# Patient Record
Sex: Female | Born: 1990 | Hispanic: No | Marital: Married | Smoking: Never smoker
Health system: Southern US, Community
[De-identification: ages and names within clinical notes are randomized; demographics above are authoritative.]

## PROBLEM LIST (undated history)

## (undated) DIAGNOSIS — M797 Fibromyalgia: Secondary | ICD-10-CM

## (undated) DIAGNOSIS — Z30432 Encounter for removal of intrauterine contraceptive device: Principal | ICD-10-CM

## (undated) DIAGNOSIS — I7300 Raynaud's syndrome without gangrene: Secondary | ICD-10-CM

## (undated) DIAGNOSIS — D649 Anemia, unspecified: Secondary | ICD-10-CM

## (undated) DIAGNOSIS — E66811 Obesity, class 1: Secondary | ICD-10-CM

## (undated) DIAGNOSIS — N921 Excessive and frequent menstruation with irregular cycle: Principal | ICD-10-CM

## (undated) DIAGNOSIS — F32A Depression, unspecified: Secondary | ICD-10-CM

## (undated) DIAGNOSIS — F329 Major depressive disorder, single episode, unspecified: Secondary | ICD-10-CM

## (undated) DIAGNOSIS — E669 Obesity, unspecified: Secondary | ICD-10-CM

## (undated) DIAGNOSIS — F419 Anxiety disorder, unspecified: Secondary | ICD-10-CM

## (undated) DIAGNOSIS — E010 Iodine-deficiency related diffuse (endemic) goiter: Secondary | ICD-10-CM

## (undated) DIAGNOSIS — K8010 Calculus of gallbladder with chronic cholecystitis without obstruction: Secondary | ICD-10-CM

## (undated) DIAGNOSIS — G43109 Migraine with aura, not intractable, without status migrainosus: Secondary | ICD-10-CM

## (undated) DIAGNOSIS — Z309 Encounter for contraceptive management, unspecified: Secondary | ICD-10-CM

## (undated) HISTORY — DX: Excessive and frequent menstruation with irregular cycle: N92.1

## (undated) HISTORY — DX: Depression, unspecified: F32.A

## (undated) HISTORY — DX: Obesity, class 1: E66.811

## (undated) HISTORY — DX: Encounter for contraceptive management, unspecified: Z30.9

## (undated) HISTORY — DX: Encounter for removal of intrauterine contraceptive device: Z30.432

## (undated) HISTORY — DX: Fibromyalgia: M79.7

## (undated) HISTORY — DX: Migraine with aura, not intractable, without status migrainosus: G43.109

## (undated) HISTORY — DX: Anxiety disorder, unspecified: F41.9

## (undated) HISTORY — PX: FOOT SURGERY: SHX648

## (undated) HISTORY — DX: Major depressive disorder, single episode, unspecified: F32.9

## (undated) HISTORY — DX: Calculus of gallbladder with chronic cholecystitis without obstruction: K80.10

## (undated) HISTORY — DX: Obesity, unspecified: E66.9

## (undated) HISTORY — DX: Raynaud's syndrome without gangrene: I73.00

## (undated) HISTORY — DX: Iodine-deficiency related diffuse (endemic) goiter: E01.0

## (undated) HISTORY — DX: Anemia, unspecified: D64.9

---
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
## 2008-02-02 ENCOUNTER — Other Ambulatory Visit: Admission: RE | Admit: 2008-02-02 | Discharge: 2008-02-02 | Payer: Self-pay | Admitting: Obstetrics and Gynecology

## 2009-03-15 ENCOUNTER — Other Ambulatory Visit: Admission: RE | Admit: 2009-03-15 | Discharge: 2009-03-15 | Payer: Self-pay | Admitting: Obstetrics and Gynecology

## 2010-11-16 ENCOUNTER — Other Ambulatory Visit (HOSPITAL_COMMUNITY)
Admission: RE | Admit: 2010-11-16 | Discharge: 2010-11-16 | Disposition: A | Discharge status: Home / Self Care | Payer: BC Managed Care – PPO | Source: Ambulatory Visit | Attending: Obstetrics and Gynecology | Admitting: Obstetrics and Gynecology

## 2010-11-16 DIAGNOSIS — Z01419 Encounter for gynecological examination (general) (routine) without abnormal findings: Secondary | ICD-10-CM | POA: Insufficient documentation

## 2010-11-16 DIAGNOSIS — Z113 Encounter for screening for infections with a predominantly sexual mode of transmission: Secondary | ICD-10-CM | POA: Insufficient documentation

## 2011-03-27 NOTE — L&D Delivery Note (Signed)
Delivery Note At 6:05 PM a viable female was delivered via Vaginal, Spontaneous Delivery (Presentation: ;  ).  APGAR: 9, 9; weight 9 lb 9.3 oz (4346 g).   Placenta status: Intact, Spontaneous.  Cord: 3 vessels with the following complications: None.    Anesthesia: Epidural  Episiotomy: None Lacerations: 2nd degree Suture Repair: 2.0 3.0 vicryl Est. Blood Loss (mL): 250cc  Mom to postpartum.  Baby to nursery-stable.  Stellar Gensel 06/10/2011, 9:24 PM

## 2011-05-25 LAB — STREP B DNA PROBE: GBS: NEGATIVE

## 2011-05-28 LAB — ABO/RH: RH Type: POSITIVE

## 2011-06-09 ENCOUNTER — Inpatient Hospital Stay (HOSPITAL_COMMUNITY)
Admission: AD | Admit: 2011-06-09 | Discharge: 2011-06-09 | Disposition: A | Discharge status: Home / Self Care | Payer: Managed Care, Other (non HMO) | Source: Ambulatory Visit | Attending: Family Medicine | Admitting: Family Medicine

## 2011-06-09 ENCOUNTER — Encounter (HOSPITAL_COMMUNITY): Payer: Self-pay | Admitting: *Deleted

## 2011-06-09 DIAGNOSIS — O479 False labor, unspecified: Secondary | ICD-10-CM

## 2011-06-09 NOTE — MAU Provider Note (Signed)
Chart reviewed and agree with management and plan.  

## 2011-06-09 NOTE — MAU Note (Signed)
Pt reports having ctxs on and off all night. Reports 4-5 min apart now has some brownish discharge a good fetal movement.

## 2011-06-09 NOTE — Discharge Instructions (Signed)

## 2011-06-09 NOTE — MAU Provider Note (Signed)
  History     CSN: [id number]  Arrival date and time: 06/09/11 1020   First Provider Initiated Contact with Patient 06/09/11 1059      Chief Complaint  Patient presents with  . Labor Eval   HPI Patient presents with contractions.  She is feeling them every 5 minutes.  They began 24 hours ago and have gotten closer together.  No vaginal bleeding, leakage of fluid.  Good fetal movement. Her pregnancy has been uneventful, she is only taking a prenatal vitamin. She was at [facility] yesterday and her cervix was 2 cm dilated.   OB History    Grav Para Term Preterm Abortions TAB SAB Ect Mult Living   1               Past Medical History  Diagnosis Date  . No pertinent past medical history     Past Surgical History  Procedure Date  . Foot surgery     Family History  Problem Relation Age of Onset  . Fibromyalgia Mother   . Fibromyalgia Sister     History  Substance Use Topics  . Smoking status: Never Smoker   . Smokeless tobacco: Not on file  . Alcohol Use: No    Allergies: No Known Allergies  Prescriptions prior to admission  Medication Sig Dispense Refill  . Prenatal Vit-Fe Fumarate-FA (PRENATAL MULTIVITAMIN) TABS Take 1 tablet by mouth daily.        ROS Physical Exam   Blood pressure 118/60, pulse 94, temperature 98.8 F (37.1 C), temperature source Oral, resp. rate 18.  Physical Exam  Constitutional: She is oriented to person, place, and time. She appears well-developed and well-nourished. No distress.  HENT:  Head: Normocephalic and atraumatic.  Mouth/Throat: Oropharynx is clear and moist.  Eyes: Pupils are equal, round, and reactive to light.  Neck: Normal range of motion. No JVD present.  Cardiovascular: Normal rate, regular rhythm and normal heart sounds.   Respiratory: Effort normal and breath sounds normal.  GI:       Gravid, non-tender.   Musculoskeletal: She exhibits no edema.  Neurological: She is alert and oriented to person, place, and time.    Fetal monitor: baseline 140's + accels, no decels.  Toco: No contractions SVE: 2/50%/-2  SVE recheck after 1 hour of ambulation: 2/50%/-2  MAU Course  Procedures  MDM Patient walked x 1 hour, with intermittent monitoring, no contractions seen, no cervical change.   Assessment and Plan  21 year old G1 @ [redacted]w[redacted]d who presents complaining of contractions, no cervical change:  -not in active labor, may be early labor.  -advised patient of this, advised to return when contraction intensity increases.   Vernis Cabacungan 06/09/2011, 12:14 PM

## 2011-06-10 ENCOUNTER — Encounter (HOSPITAL_COMMUNITY): Payer: Self-pay | Admitting: Anesthesiology

## 2011-06-10 ENCOUNTER — Encounter (HOSPITAL_COMMUNITY): Payer: Self-pay | Admitting: Obstetrics and Gynecology

## 2011-06-10 ENCOUNTER — Inpatient Hospital Stay (HOSPITAL_COMMUNITY)
Admission: AD | Admit: 2011-06-10 | Discharge: 2011-06-12 | DRG: 775 | Disposition: A | Discharge status: Home / Self Care | Payer: Managed Care, Other (non HMO) | Source: Ambulatory Visit | Attending: Family Medicine | Admitting: Family Medicine

## 2011-06-10 ENCOUNTER — Encounter (HOSPITAL_COMMUNITY): Payer: Self-pay | Admitting: *Deleted

## 2011-06-10 ENCOUNTER — Inpatient Hospital Stay (HOSPITAL_COMMUNITY): Payer: Managed Care, Other (non HMO) | Admitting: Anesthesiology

## 2011-06-10 LAB — CBC
HCT: 41 % (ref 36.0–46.0)
MCHC: 33.4 g/dL (ref 30.0–36.0)
RDW: 14.7 % (ref 11.5–15.5)
WBC: 14.4 10*3/uL — ABNORMAL HIGH (ref 4.0–10.5)

## 2011-06-10 LAB — RPR: RPR Ser Ql: NONREACTIVE

## 2011-06-10 MED ORDER — LACTATED RINGERS IV SOLN: 500.0000 mL | INTRAVENOUS | Status: DC | PRN

## 2011-06-10 MED ORDER — TETANUS-DIPHTH-ACELL PERTUSSIS 5-2.5-18.5 LF-MCG/0.5 IM SUSP
0.5000 mL | Freq: Once | INTRAMUSCULAR | Status: AC
  Administered 2011-06-11: 0.5 mL via INTRAMUSCULAR
  Filled 2011-06-10: qty 0.5

## 2011-06-10 MED ORDER — TERBUTALINE SULFATE 1 MG/ML IJ SOLN: 0.2500 mg | Freq: Once | INTRAMUSCULAR | Status: DC | PRN

## 2011-06-10 MED ORDER — MISOPROSTOL 200 MCG PO TABS
ORAL_TABLET | ORAL | Status: AC
  Administered 2011-06-10: 800 ug
  Filled 2011-06-10: qty 4

## 2011-06-10 MED ORDER — LACTATED RINGERS IV SOLN
INTRAVENOUS | Status: DC
  Administered 2011-06-10 (×2): via INTRAVENOUS

## 2011-06-10 MED ORDER — LIDOCAINE HCL (PF) 1 % IJ SOLN
INTRAMUSCULAR | Status: DC | PRN
  Administered 2011-06-10: 4 mL
  Administered 2011-06-10: 5 mL

## 2011-06-10 MED ORDER — ONDANSETRON HCL 4 MG/2ML IJ SOLN
4.0000 mg | Freq: Four times a day (QID) | INTRAMUSCULAR | Status: DC | PRN
  Administered 2011-06-10: 4 mg via INTRAVENOUS
  Filled 2011-06-10: qty 2

## 2011-06-10 MED ORDER — OXYTOCIN 20 UNITS IN LACTATED RINGERS INFUSION - SIMPLE: 125.0000 mL/h | Freq: Once | INTRAVENOUS | Status: DC

## 2011-06-10 MED ORDER — LIDOCAINE HCL (PF) 1 % IJ SOLN
30.0000 mL | INTRAMUSCULAR | Status: DC | PRN
  Administered 2011-06-10: 30 mL via SUBCUTANEOUS
  Filled 2011-06-10: qty 30

## 2011-06-10 MED ORDER — EPHEDRINE 5 MG/ML INJ
INTRAVENOUS | Status: AC
  Filled 2011-06-10: qty 4

## 2011-06-10 MED ORDER — SENNOSIDES-DOCUSATE SODIUM 8.6-50 MG PO TABS
2.0000 | ORAL_TABLET | Freq: Every day | ORAL | Status: DC
  Administered 2011-06-10 – 2011-06-11 (×2): 2 via ORAL

## 2011-06-10 MED ORDER — BENZOCAINE-MENTHOL 20-0.5 % EX AERO
INHALATION_SPRAY | CUTANEOUS | Status: AC
  Filled 2011-06-10: qty 56

## 2011-06-10 MED ORDER — PHENYLEPHRINE 40 MCG/ML (10ML) SYRINGE FOR IV PUSH (FOR BLOOD PRESSURE SUPPORT): 80.0000 ug | PREFILLED_SYRINGE | INTRAVENOUS | Status: DC | PRN

## 2011-06-10 MED ORDER — FENTANYL 2.5 MCG/ML BUPIVACAINE 1/10 % EPIDURAL INFUSION (WH - ANES)
14.0000 mL/h | INTRAMUSCULAR | Status: DC
  Administered 2011-06-10: 15 mL/h via EPIDURAL
  Filled 2011-06-10: qty 60

## 2011-06-10 MED ORDER — OXYCODONE-ACETAMINOPHEN 5-325 MG PO TABS: 1.0000 | ORAL_TABLET | ORAL | Status: DC | PRN

## 2011-06-10 MED ORDER — EPHEDRINE 5 MG/ML INJ: 10.0000 mg | INTRAVENOUS | Status: DC | PRN

## 2011-06-10 MED ORDER — FENTANYL 2.5 MCG/ML BUPIVACAINE 1/10 % EPIDURAL INFUSION (WH - ANES)
INTRAMUSCULAR | Status: AC
  Administered 2011-06-10: 15 mL/h via EPIDURAL
  Filled 2011-06-10: qty 60

## 2011-06-10 MED ORDER — ACETAMINOPHEN 325 MG PO TABS: 650.0000 mg | ORAL_TABLET | ORAL | Status: DC | PRN

## 2011-06-10 MED ORDER — PHENYLEPHRINE 40 MCG/ML (10ML) SYRINGE FOR IV PUSH (FOR BLOOD PRESSURE SUPPORT)
PREFILLED_SYRINGE | INTRAVENOUS | Status: AC
  Filled 2011-06-10: qty 5

## 2011-06-10 MED ORDER — ONDANSETRON HCL 4 MG/2ML IJ SOLN: 4.0000 mg | INTRAMUSCULAR | Status: DC | PRN

## 2011-06-10 MED ORDER — ONDANSETRON HCL 4 MG PO TABS: 4.0000 mg | ORAL_TABLET | ORAL | Status: DC | PRN

## 2011-06-10 MED ORDER — DIBUCAINE 1 % RE OINT: 1.0000 "application " | TOPICAL_OINTMENT | RECTAL | Status: DC | PRN

## 2011-06-10 MED ORDER — LACTATED RINGERS IV SOLN
500.0000 mL | Freq: Once | INTRAVENOUS | Status: AC
  Administered 2011-06-10: 500 mL via INTRAVENOUS

## 2011-06-10 MED ORDER — DIPHENHYDRAMINE HCL 25 MG PO CAPS: 25.0000 mg | ORAL_CAPSULE | Freq: Four times a day (QID) | ORAL | Status: DC | PRN

## 2011-06-10 MED ORDER — DIPHENHYDRAMINE HCL 50 MG/ML IJ SOLN: 12.5000 mg | INTRAMUSCULAR | Status: DC | PRN

## 2011-06-10 MED ORDER — SIMETHICONE 80 MG PO CHEW: 80.0000 mg | CHEWABLE_TABLET | ORAL | Status: DC | PRN

## 2011-06-10 MED ORDER — FENTANYL 2.5 MCG/ML BUPIVACAINE 1/10 % EPIDURAL INFUSION (WH - ANES)
INTRAMUSCULAR | Status: DC | PRN
  Administered 2011-06-10: 15 mL/h via EPIDURAL

## 2011-06-10 MED ORDER — IBUPROFEN 600 MG PO TABS
600.0000 mg | ORAL_TABLET | Freq: Four times a day (QID) | ORAL | Status: DC
  Administered 2011-06-10 – 2011-06-12 (×6): 600 mg via ORAL
  Filled 2011-06-10 (×6): qty 1

## 2011-06-10 MED ORDER — BENZOCAINE-MENTHOL 20-0.5 % EX AERO
1.0000 "application " | INHALATION_SPRAY | CUTANEOUS | Status: DC | PRN
  Administered 2011-06-10: 21:00:00 via TOPICAL

## 2011-06-10 MED ORDER — FLEET ENEMA 7-19 GM/118ML RE ENEM: 1.0000 | ENEMA | RECTAL | Status: DC | PRN

## 2011-06-10 MED ORDER — OXYTOCIN 20 UNITS IN LACTATED RINGERS INFUSION - SIMPLE
1.0000 m[IU]/min | INTRAVENOUS | Status: DC
  Administered 2011-06-10: 2 m[IU]/min via INTRAVENOUS

## 2011-06-10 MED ORDER — CITRIC ACID-SODIUM CITRATE 334-500 MG/5ML PO SOLN: 30.0000 mL | ORAL | Status: DC | PRN

## 2011-06-10 MED ORDER — ZOLPIDEM TARTRATE 5 MG PO TABS: 5.0000 mg | ORAL_TABLET | Freq: Every evening | ORAL | Status: DC | PRN

## 2011-06-10 MED ORDER — OXYTOCIN BOLUS FROM INFUSION
500.0000 mL | Freq: Once | INTRAVENOUS | Status: AC
  Administered 2011-06-10: 500 mL via INTRAVENOUS
  Filled 2011-06-10: qty 1000
  Filled 2011-06-10: qty 500

## 2011-06-10 MED ORDER — NALBUPHINE SYRINGE 5 MG/0.5 ML
5.0000 mg | INJECTION | INTRAMUSCULAR | Status: DC | PRN
  Administered 2011-06-10: 5 mg via INTRAVENOUS
  Filled 2011-06-10: qty 0.5

## 2011-06-10 MED ORDER — LANOLIN HYDROUS EX OINT: TOPICAL_OINTMENT | CUTANEOUS | Status: DC | PRN

## 2011-06-10 MED ORDER — PRENATAL MULTIVITAMIN CH
1.0000 | ORAL_TABLET | Freq: Every day | ORAL | Status: DC
  Administered 2011-06-11 – 2011-06-12 (×2): 1 via ORAL
  Filled 2011-06-10 (×2): qty 1

## 2011-06-10 MED ORDER — IBUPROFEN 600 MG PO TABS: 600.0000 mg | ORAL_TABLET | Freq: Four times a day (QID) | ORAL | Status: DC | PRN

## 2011-06-10 MED ORDER — WITCH HAZEL-GLYCERIN EX PADS: 1.0000 "application " | MEDICATED_PAD | CUTANEOUS | Status: DC | PRN

## 2011-06-10 NOTE — H&P (Signed)
Chief Complaint:  Labor Eval   HPI:   Michelle Knapp is a 21 y.o. G1P0000  with Estimated Date of Delivery: 06/15/11 by LMP , now at  [redacted]w[redacted]d weeks gestation who presents with complaint of  contractions every 4 minutes lasting 30-60 seconds. Patient reports the fetal movement as active,  vaginal bleeding as less flow than a normal period and  she describes fluid per vagina as None.  Other associated symptoms include none.    She receives her prenatal care at  [facility] and her prenatal course has been complicated by nothing.   Past Obstetrical History: OB History    Grav Para Term Preterm Abortions TAB SAB Ect Mult Living   1 0 0 0 0 0 0 0 0 0       Past Medical History: Past Medical History  Diagnosis Date  . No pertinent past medical history     Past Surgical History: Past Surgical History  Procedure Date  . Foot surgery     Family History: Family History  Problem Relation Age of Onset  . Fibromyalgia Mother   . Fibromyalgia Sister     Social History: History  Substance Use Topics  . Smoking status: Never Smoker   . Smokeless tobacco: Not on file  . Alcohol Use: No    Allergies: No Known Allergies  Home Medications: Prescriptions prior to admission  Medication Sig Dispense Refill  . Prenatal Vit-Fe Fumarate-FA (PRENATAL MULTIVITAMIN) TABS Take 1 tablet by mouth daily.        General ROS:  negative Focused OB Physical: Cervical Exam: Dilation: 3.5 Effacement (%): 100 Cervical Position: Middle Station: -2 Presentation: Vertex Exam by:: J. Rasch RN  Fetal presentation: cephalic Membranes:intact  External Fetal Monitoring:  Baseline: 140 bpm, Variability: Good {> 6 bpm) and Accelerations: Reactive and contractions are regular, every 4 minutes with moderate intensity.  Labs: No results found for this or any previous visit (from the past 24 hour(s)). GBS negative  ASSESSMENT: Michelle Knapp G1P0000 [redacted]w[redacted]d at weeks gestation admitted for spontaneous onset of  labor  PLAN: Admit to L&D for labor. Patient would like epidural eventually.  [name] 06/10/2011,7:49 AM

## 2011-06-10 NOTE — Anesthesia Preprocedure Evaluation (Signed)
Anesthesia Evaluation  Patient identified by MRN, date of birth, ID band Patient awake    Reviewed: Allergy & Precautions, H&P , Patient's Chart, lab work & pertinent test results  Airway Mallampati: II TM Distance: >3 FB Neck ROM: full    Dental No notable dental hx. (+) Teeth Intact   Pulmonary neg pulmonary ROS,  breath sounds clear to auscultation  Pulmonary exam normal       Cardiovascular negative cardio ROS  Rhythm:regular Rate:Normal     Neuro/Psych negative neurological ROS  negative psych ROS   GI/Hepatic negative GI ROS, Neg liver ROS,   Endo/Other  Morbid obesity  Renal/GU negative Renal ROS  negative genitourinary   Musculoskeletal   Abdominal Normal abdominal exam  (+)   Peds  Hematology negative hematology ROS (+)   Anesthesia Other Findings   Reproductive/Obstetrics (+) Pregnancy                           Anesthesia Physical Anesthesia Plan  ASA: III  Anesthesia Plan: Epidural   Post-op Pain Management:    Induction:   Airway Management Planned:   Additional Equipment:   Intra-op Plan:   Post-operative Plan:   Informed Consent: I have reviewed the patients History and Physical, chart, labs and discussed the procedure including the risks, benefits and alternatives for the proposed anesthesia with the patient or authorized representative who has indicated his/her understanding and acceptance.     Plan Discussed with: Anesthesiologist and Surgeon  Anesthesia Plan Comments:         Anesthesia Quick Evaluation

## 2011-06-10 NOTE — Progress Notes (Signed)
Patient ID: Michelle Knapp, female   DOB: 1990/06/06, 21 y.o.   MRN: [id number] Michelle Knapp is a 21 y.o. G1P0000 at [redacted]w[redacted]d admitted for active labor  Subjective: Doing well, feeling some pressure in her back/butt and some tightening with her contractions but overall very comfortable.   Objective: BP 95/71  Pulse 134  Temp(Src) 98.6 F (37 C) (Oral)  Resp 20  Ht 5\' 9"  (1.753 m)  Wt 247 lb (112.038 kg)  BMI 36.48 kg/m2  SpO2 100%      FHT:  FHR: 130 bpm, variability: minimal ,  accelerations:  Present,  decelerations:  Absent UC:   regular, every 1-2 minutes; MVUs <200 SVE:   Dilation: 8 Effacement (%): 100 Station: 0 Exam by:: DHart Rochester  Labs: Lab Results  Component Value Date   WBC 14.4* 06/10/2011   HGB 13.7 06/10/2011   HCT 41.0 06/10/2011   MCV 87.8 06/10/2011   PLT 188 06/10/2011    Assessment / Plan: Spontaneous labor, progressing normally  Labor: Ctx inadequate, but patient making cervical change so will hold on pitocin at this time Fetal Wellbeing:  Category I Pain Control:  Epidural I/D:  n/a Anticipated MOD:  NSVD  Michelle Knapp 06/10/2011, 3:34 PM

## 2011-06-10 NOTE — Progress Notes (Signed)
Patient ID: Michelle Knapp, female   DOB: [birth year], 21 y.o.   MRN: [id number] Uc's inadequate, will start pitocin to facilitate descent. FHR pattern reassuring with accels noted.

## 2011-06-10 NOTE — MAU Note (Signed)
Pt presents to MAU with chief complaint of labor/contractions. Pt was here yesterday and sent home with labor precautions; cervix yesterday was 2cm and 50%. Pt says she was up all night contracting and feels the contractions have gotten stronger.

## 2011-06-10 NOTE — Anesthesia Procedure Notes (Signed)
Epidural Patient location during procedure: OB Start time: 06/10/2011 1:06 PM  Staffing Anesthesiologist: Allure Greaser A. Performed by: anesthesiologist   Preanesthetic Checklist Completed: patient identified, site marked, surgical consent, pre-op evaluation, timeout performed, IV checked, risks and benefits discussed and monitors and equipment checked  Epidural Patient position: sitting Prep: site prepped and draped and DuraPrep Patient monitoring: continuous pulse ox and blood pressure Approach: midline Injection technique: LOR air  Needle:  Needle type: Tuohy  Needle gauge: 17 G Needle length: 9 cm Needle insertion depth: 6 cm Catheter type: closed end flexible Catheter size: 19 Gauge Catheter at skin depth: 11 cm Test dose: negative and Other  Assessment Events: blood not aspirated, injection not painful, no injection resistance, negative IV test and no paresthesia  Additional Notes Patient identified. Risks and benefits discussed including failed block, incomplete  Pain control, post dural puncture headache, nerve damage, paralysis, blood pressure Changes, nausea, vomiting, reactions to medications-both toxic and allergic and post Partum back pain. All questions were answered. Patient expressed understanding and wished to proceed. Sterile technique was used throughout procedure. Epidural site was Dressed with sterile barrier dressing. No paresthesias, signs of intravascular injection Or signs of intrathecal spread were encountered.  Patient was more comfortable after the epidural was dosed. Please see RN's note for documentation of vital signs and FHR which are stable.

## 2011-06-10 NOTE — Progress Notes (Signed)
Patient ID: Michelle Knapp, female   DOB: 10-07-1990, 21 y.o.   MRN: [id number] SVE 4/100/-1 AROM light mec noted. Fhr pattern reassuring.

## 2011-06-11 NOTE — Progress Notes (Signed)
Post Partum Day 1 Subjective: no complaints, up ad lib, voiding, tolerating PO and breastfeeding, hospital circumcision. She states that she sees [facility] and is going to use contraception most likely OCP.   Objective: Blood pressure 118/71, pulse 105, temperature 99.5 F (37.5 C), temperature source Oral, resp. rate 18, height 5\' 9"  (1.753 m), weight 112.038 kg (247 lb), SpO2 66.00%, unknown if currently breastfeeding.  Physical Exam:  General: alert, cooperative and appears stated age Lochia: appropriate Uterine Fundus: soft DVT Evaluation: No evidence of DVT seen on physical exam. Negative Homan's sign. No cords or calf tenderness. No significant calf/ankle edema.   Basename 06/10/11 0845  HGB 13.7  HCT 41.0    Assessment/Plan: Plan for discharge tomorrow, Breastfeeding, Lactation consult, Circumcision prior to discharge and Contraception OCP   LOS: 1 day   Iverson Alamin 06/11/2011, 7:51 AM

## 2011-06-11 NOTE — Anesthesia Postprocedure Evaluation (Signed)
  Anesthesia Post-op Note  Patient: Michelle Knapp  Procedure(s) Performed: * No procedures listed *  Patient Location: PACU and Mother/Baby  Anesthesia Type: Epidural  Level of Consciousness: awake  Airway and Oxygen Therapy: Patient Spontanous Breathing  Post-op Pain: none  Post-op Assessment: Patient's Cardiovascular Status Stable, Respiratory Function Stable, Patent Airway, No signs of Nausea or vomiting, Adequate PO intake, Pain level controlled, No headache, No backache, No residual numbness and No residual motor weakness  Post-op Vital Signs: Reviewed and stable  Complications: No apparent anesthesia complications

## 2011-06-11 NOTE — Progress Notes (Signed)
PGY-1 Addendum  Patient seen and examined by me. I agree with the note above by PA student Dunn. Patient doing well. Plan for discharge tomorrow. All questions and concerns addressed.  Pranathi Winfree M. Demi Trieu, M.D. 06/11/2011 8:00 AM

## 2011-06-12 MED ORDER — IBUPROFEN 600 MG PO TABS: 600.0000 mg | ORAL_TABLET | Freq: Four times a day (QID) | ORAL | Status: AC

## 2011-06-12 MED ORDER — OXYCODONE-ACETAMINOPHEN 5-325 MG PO TABS: 1.0000 | ORAL_TABLET | ORAL | Status: AC | PRN

## 2011-06-12 NOTE — Progress Notes (Signed)
Post Partum Day #2 Subjective: no complaints, up ad lib, voiding, tolerating PO and + flatus Breast feeding with no difficulty. Patient has no questions. Plans on outpatient circ.  Objective: Blood pressure 97/63, pulse 68, temperature 97.7 F (36.5 C), temperature source Oral, resp. rate 18, height 5\' 9"  (1.753 m), weight 112.038 kg (247 lb), SpO2 66.00%, unknown if currently breastfeeding.  Physical Exam:  General: alert, cooperative and no distress Lochia: appropriate Uterine Fundus: firm Incision: N/A DVT Evaluation: No evidence of DVT seen on physical exam.   Basename 06/10/11 0845  HGB 13.7  HCT 41.0    Assessment/Plan: Discharge home, Breastfeeding and Contraception undecided Will follow up at [facility]. All questions addressed.   LOS: 2 days   Michelle Knapp 06/12/2011, 8:01 AM

## 2011-06-12 NOTE — Discharge Summary (Signed)
Agree with above note.  [name] 06/12/2011 10:17 AM

## 2011-06-12 NOTE — Discharge Instructions (Signed)
Vaginal Delivery Care After  Change your pad on each trip to the bathroom.   Wipe gently with toilet paper during your hospital stay. Always wipe from front to back. A spray bottle with warm tap water could also be used or a towelette if available.   Place your soiled pad and toilet paper in a bathroom wastebasket with a plastic bag liner.   During your hospital stay, save any clots. If you pass a clot while on the toilet, do not flush it. Also, if your vaginal flow seems excessive to you, notify nursing personnel.   The first time you get out of bed after delivery, wait for assistance from a nurse. Do not get up alone at any time if you feel weak or dizzy.   Bend and extend your ankles forcefully so that you feel the calves of your legs get hard. Do this 6 times every hour when you are in bed and awake.   Do not sit with one foot under you, dangle your legs over the edge of the bed, or maintain a position that hinders the circulation in your legs.   Many women experience after pains for 2 to 3 days after delivery. These after pains are mild uterine contractions. Ask the nurse for a pain medication if you need something for this. Sometimes breastfeeding stimulates after pains; if you find this to be true, ask for the medication  -  hour before the next feeding.   For you and your infant's protection, do not go beyond the door(s) of the obstetric unit. Do not carry your baby in your arms in the hallway. When taking your baby to and from your room, put your baby in the bassinet and push the bassinet.   Mothers may have their babies in their room as much as they desire.  Document Released: 03/09/2000 Document Revised: 03/01/2011 Document Reviewed: 02/07/2007 ExitCare Patient Information 2012 [employer]. 

## 2011-06-12 NOTE — Discharge Summary (Signed)
Obstetric Discharge Summary Reason for Admission: onset of labor Prenatal Procedures: NST and ultrasound Intrapartum Procedures: spontaneous vaginal delivery Postpartum Procedures: none Complications-Operative and Postpartum: 2nd degree perineal laceration Hemoglobin  Date Value Range Status  06/10/2011 13.7  12.0-15.0 (g/dL) Final     HCT  Date Value Range Status  06/10/2011 41.0  36.0-46.0 (%) Final    Physical Exam:  General: alert, cooperative and no distress Lochia: appropriate Uterine Fundus: firm Incision: n/a DVT Evaluation: No evidence of DVT seen on physical exam.  Discharge Diagnoses: Term Pregnancy-delivered  Discharge Information: Date: 06/12/2011 Activity: pelvic rest Diet: routine Medications: Ibuprofen and Percocet Condition: stable Instructions: refer to practice specific booklet Discharge to: home   Newborn Data: Live born female  Birth Weight: 9 lb 9.3 oz (4346 g) APGAR: 9, 9  Home with mother. Desires outpatient circ. Breast feeding with no difficulty. Will follow up at [facility]. Unsure of contraception, but most likely OCP.  Michelle Knapp 06/12/2011, 8:55 AM

## 2011-06-20 NOTE — Progress Notes (Signed)
UR chart review completed.  

## 2012-08-06 ENCOUNTER — Other Ambulatory Visit: Payer: Self-pay | Admitting: Obstetrics and Gynecology

## 2012-10-20 ENCOUNTER — Ambulatory Visit (INDEPENDENT_AMBULATORY_CARE_PROVIDER_SITE_OTHER): Payer: BC Managed Care – PPO | Admitting: Family Medicine

## 2012-10-20 ENCOUNTER — Encounter: Payer: Self-pay | Admitting: Family Medicine

## 2012-10-20 VITALS — BP 143/89 | HR 84 | Temp 98.3°F | Resp 16 | Ht 69.0 in | Wt 192.0 lb

## 2012-10-20 DIAGNOSIS — R5381 Other malaise: Secondary | ICD-10-CM

## 2012-10-20 DIAGNOSIS — R10816 Epigastric abdominal tenderness: Secondary | ICD-10-CM

## 2012-10-20 DIAGNOSIS — E049 Nontoxic goiter, unspecified: Secondary | ICD-10-CM

## 2012-10-20 DIAGNOSIS — E010 Iodine-deficiency related diffuse (endemic) goiter: Secondary | ICD-10-CM

## 2012-10-20 DIAGNOSIS — M7918 Myalgia, other site: Secondary | ICD-10-CM

## 2012-10-20 DIAGNOSIS — M791 Myalgia, unspecified site: Secondary | ICD-10-CM

## 2012-10-20 DIAGNOSIS — IMO0001 Reserved for inherently not codable concepts without codable children: Secondary | ICD-10-CM

## 2012-10-20 DIAGNOSIS — R5383 Other fatigue: Secondary | ICD-10-CM

## 2012-10-20 LAB — CBC WITH DIFFERENTIAL/PLATELET
Basophils Relative: 0.5 % (ref 0.0–3.0)
Eosinophils Relative: 0.8 % (ref 0.0–5.0)
HCT: 41.5 % (ref 36.0–46.0)
Hemoglobin: 13.8 g/dL (ref 12.0–15.0)
Lymphs Abs: 2.6 10*3/uL (ref 0.7–4.0)
MCV: 88.7 fl (ref 78.0–100.0)
Monocytes Absolute: 0.8 10*3/uL (ref 0.1–1.0)
Monocytes Relative: 8.1 % (ref 3.0–12.0)
RBC: 4.68 Mil/uL (ref 3.87–5.11)
WBC: 9.8 10*3/uL (ref 4.5–10.5)

## 2012-10-20 LAB — COMPREHENSIVE METABOLIC PANEL
CO2: 29 mEq/L (ref 19–32)
Calcium: 9.3 mg/dL (ref 8.4–10.5)
Chloride: 103 mEq/L (ref 96–112)
Creatinine, Ser: 0.7 mg/dL (ref 0.4–1.2)
GFR: 116.62 mL/min (ref >60.00)
Glucose, Bld: 72 mg/dL (ref 70–99)
Total Bilirubin: 0.4 mg/dL (ref 0.3–1.2)
Total Protein: 7.1 g/dL (ref 6.0–8.3)

## 2012-10-20 LAB — SEDIMENTATION RATE: Sed Rate: 5 mm/hr (ref 0–22)

## 2012-10-20 LAB — T4, FREE: Free T4: 0.91 ng/dL (ref 0.60–1.60)

## 2012-10-20 LAB — CK: Total CK: 50 U/L (ref 7–177)

## 2012-10-20 NOTE — Progress Notes (Addendum)
Office Note 10/20/2012  CC:  Chief Complaint  Patient presents with  . Establish Care    HPI:  Michelle Knapp is a 22 y.o. White female who is here to establish care. Patient's most recent primary MD: none Old records were not reviewed prior to or during today's visit.  Approx 1 yr hx of inferior sternum region pain that is worse after mild activity but not consistently and often awakens her from sleep.  Described as severe soreness/pressure/burning, hurts when she presses on the area. No effect from eating.  Mom has shared her flexeril and hydrocodone with her and she has taken it on the most severe occasions.  No OTC meds have been tried for this pain.  Denies GERD.  In addition she has approx 2-3 yr hx of feeling more muscle aches in neck, upper shoulders, sometimes hips, sometimes lower back----after doing normal activity like housework.  Used to be inconsistent but now has been occuring more like every week for the last several months.  She used to walk about 2 mi per day up until about 1 mo ago (due to lack of time, working on house lately, etc) and this caused no problem.   ROS: +chronic fatigue.  +stiffness in LE's mostly after sitting for a long time. no joint swelling, no unexplained rashes.  No unexplained fevers or wt loss. She has hx of loose postprandial BMs esp when nervous.  No blood or mucous in stools. Appetite is good.  No insomnia.  No HAs or vision or hearing changes.  No weakness or paresthesias. +hx of feet turning purplish when she dangles them--resolves with movement.  No hx of hands turning colors or having temp changes.  No cough, no ST, no SOB.   No palpitations.   Bp has run a bit higher ever since having her son 16 mo ago (no HTN during preg). No chronic dep or anxiety.  No urinary sx's. LMP was about 2 wks ago, menses regular, describes heavy bleeding with menses x 4d, 2 days light.  + hx of iron def anemia from menorrhagia.      Past Medical History   Diagnosis Date  . 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 Foot surgery  age [age] yrs    arches lowered on both feet: hx of both feet with chronic pain and episodes of turning blue    Family History  Problem Relation Age of Onset  . Fibromyalgia Mother   . Fibromyalgia Sister   . Diabetes Maternal Grandmother   . Diabetes Paternal Grandmother   . Cancer Paternal Grandfather     lungs    History   Social History  . Marital Status: Married    Spouse Name: N/A    Number of Children: N/A  . Years of Education: N/A   Occupational History  . Not on file.   Social History Main Topics  . Smoking status: Never Smoker   . Smokeless tobacco: Never Used  . Alcohol Use: No  . Drug Use: No  . Sexually Active: Yes    Birth Control/ Protection: None   Other Topics Concern  . Not on file   Social History Narrative   Married and lives in [place] with husband and 19 mo old son.   Homemaker.   HS: [employer].   Beauty school.     No Tob.  Occ alc.  No  drugs.   Exercise: used to walk, o/w none.    Outpatient Encounter Prescriptions as of 10/20/2012  Medication Sig Dispense Refill  . SPRINTEC 28 0.25-35 MG-MCG tablet TAKE ONE TABLET BY MOUTH EVERY DAY  28 tablet  11  . Prenatal Vit-Fe Fumarate-FA (PRENATAL MULTIVITAMIN) TABS Take 1 tablet by mouth daily.       No facility-administered encounter medications on file as of 10/20/2012.    No Known Allergies  ROS See HPI PE; Blood pressure 143/89, pulse 84, temperature 98.3 F (36.8 C), temperature source Temporal, resp. rate 16, height 5\' 9"  (1.753 m), weight 192 lb (87.091 kg), last menstrual period 10/06/2012, SpO2 100.00%, not currently breastfeeding. Gen: Alert, well appearing.  Patient is oriented to person, place, time, and situation. ENT:  Eyes: no injection, icteris, swelling, or exudate.  EOMI, PERRLA.  Fundoscopy: normal retinal  vasculature, normal optic discs. Nose: no drainage or turbinate edema/swelling.  No injection or focal lesion.  Mouth: lips without lesion/swelling.  Oral mucosa pink and moist.  Dentition intact and without obvious caries or gingival swelling.  Oropharynx without erythema, exudate, or swelling.  Neck - No masses, tenderness, or limitation in ROM.  Thyroid gland is enlarged diffusely, without nodularity/induration/discrete mass, or tenderness. CV: RRR, no m/r/g.   LUNGS: CTA bilat, nonlabored resps, good aeration in all lung fields. ABD: soft, nondistended.  She has diffuse TTP in superior-most epigastric abdominal regions diffusely and this extended to include the lower 2 ribs on each side and the lower 1/3 of the sternum centrally.  Areas of most tenderness were lower sternum and xyphoid process. No HSM or mass or bruit.  BS normal. EXT: no clubbing, cyanosis, or edema.  Musculoskeletal: no joint swelling, erythema, warmth, or tenderness.  ROM of all joints intact. Mild symmetrical tenderness to palpation of paraspinous soft tissues in neck, upper back, mid back.  No lower back or glut/sacral tenderness. No tenderness of greater trochanter areas but she has some mild tenderness of the lateral portions of iliac crests. No thigh/hamstring tenderness.  Mild tenderness diffusely in calves.   Skin - no sores or suspicious lesions or rashes or color changes.  Pertinent labs:  None today  ASSESSMENT AND PLAN:   New pt: no old records to obtain.  Myofascial pain Mostly mild, but it is unclear if the more significant sternal/xyphoid pain she has been having is musculoskeletal pain or not--possibly GI (gastritis/esophagitis). I have recommended she start prilosec 20mg  qd and since she had some diffuse upper abd tenderness (as well as her lower rib region/lower sternal region) will do abd u/s. Trial of omeprazole 20mg  qAM was recommended. With her additional c/o generalized fatigue and  musculoskeletal stiffness, will check general lab panel (CBC, CMET, CK total, ESR, Rh factor, and ANA.  Abdominal tenderness, epigastric Abd wall/lower chest wall pain vs gastritis/esophagitis. Omeprazole, labs, and imaging as mentioned above.  Thyromegaly Check TSH, free T4, T3 total and thyroid u/s.   An After Visit Summary was printed and given to the patient.  Spent 50 min with pt today, with >50% of this time spent in counseling and care coordination regarding the above problems.  Return for o/v (30 min) in 10-14d for f/u pains/labs/imaging.

## 2012-10-20 NOTE — Assessment & Plan Note (Signed)
Mostly mild, but it is unclear if the more significant sternal/xyphoid pain she has been having is musculoskeletal pain or not--possibly GI (gastritis/esophagitis). I have recommended she start prilosec 20mg  qd and since she had some diffuse upper abd tenderness (as well as her lower rib region/lower sternal region) will do abd u/s. Trial of omeprazole 20mg  qAM was recommended. With her additional c/o generalized fatigue and musculoskeletal stiffness, will check general lab panel (CBC, CMET, CK total, ESR, Rh factor, and ANA.

## 2012-10-20 NOTE — Assessment & Plan Note (Signed)
Abd wall/lower chest wall pain vs gastritis/esophagitis. Omeprazole, labs, and imaging as mentioned above.

## 2012-10-20 NOTE — Assessment & Plan Note (Signed)
Check TSH, free T4, T3 total and thyroid u/s.

## 2012-10-21 ENCOUNTER — Other Ambulatory Visit: Payer: Self-pay | Admitting: Family Medicine

## 2012-10-21 ENCOUNTER — Ambulatory Visit (HOSPITAL_COMMUNITY)
Admission: RE | Admit: 2012-10-21 | Discharge: 2012-10-21 | Disposition: A | Discharge status: Home / Self Care | Payer: BC Managed Care – PPO | Source: Ambulatory Visit | Attending: Family Medicine | Admitting: Family Medicine

## 2012-10-21 ENCOUNTER — Other Ambulatory Visit (HOSPITAL_COMMUNITY): Payer: BC Managed Care – PPO

## 2012-10-21 DIAGNOSIS — R10816 Epigastric abdominal tenderness: Secondary | ICD-10-CM

## 2012-10-21 DIAGNOSIS — R1013 Epigastric pain: Secondary | ICD-10-CM | POA: Insufficient documentation

## 2012-10-21 DIAGNOSIS — K838 Other specified diseases of biliary tract: Secondary | ICD-10-CM | POA: Insufficient documentation

## 2012-10-21 DIAGNOSIS — K8020 Calculus of gallbladder without cholecystitis without obstruction: Secondary | ICD-10-CM

## 2012-10-21 DIAGNOSIS — E010 Iodine-deficiency related diffuse (endemic) goiter: Secondary | ICD-10-CM

## 2012-10-21 LAB — ANA: Anti Nuclear Antibody(ANA): NEGATIVE

## 2012-10-22 ENCOUNTER — Encounter (HOSPITAL_COMMUNITY)
Admission: RE | Admit: 2012-10-22 | Discharge: 2012-10-22 | Disposition: A | Discharge status: Home / Self Care | Payer: BC Managed Care – PPO | Source: Ambulatory Visit | Attending: Family Medicine | Admitting: Family Medicine

## 2012-10-22 ENCOUNTER — Encounter (HOSPITAL_COMMUNITY): Payer: Self-pay

## 2012-10-22 DIAGNOSIS — R10816 Epigastric abdominal tenderness: Secondary | ICD-10-CM | POA: Insufficient documentation

## 2012-10-22 DIAGNOSIS — K8020 Calculus of gallbladder without cholecystitis without obstruction: Secondary | ICD-10-CM | POA: Insufficient documentation

## 2012-10-22 MED ORDER — TECHNETIUM TC 99M MEBROFENIN IV KIT
5.0000 | PACK | Freq: Once | INTRAVENOUS | Status: AC | PRN
  Administered 2012-10-22: 5 via INTRAVENOUS

## 2012-10-27 ENCOUNTER — Telehealth: Payer: Self-pay | Admitting: Family Medicine

## 2012-10-27 NOTE — Telephone Encounter (Signed)
Left detailed message for patient stating she does not have to return for office visit unless she wants to discuss something else with Dr Milinda Cave about current issues.

## 2012-10-27 NOTE — Telephone Encounter (Signed)
Patient wants to know if she needs to keep 10/28/12 OV since she is supposed to being seeing a Careers adviser.

## 2012-10-28 ENCOUNTER — Ambulatory Visit: Payer: BC Managed Care – PPO | Admitting: Family Medicine

## 2012-11-10 ENCOUNTER — Telehealth: Payer: Self-pay | Admitting: Family Medicine

## 2012-11-10 NOTE — Telephone Encounter (Signed)
Noted. thx 

## 2012-11-10 NOTE — Telephone Encounter (Signed)
Researched chart and no referral was put in but the imaging report states she should see surgeon.  I called [facility] and scheduled patient to see Dr. Biagio Quint on 11/15/12 at 9:30am.  Patient is aware.

## 2012-11-19 ENCOUNTER — Encounter (INDEPENDENT_AMBULATORY_CARE_PROVIDER_SITE_OTHER): Payer: Self-pay | Admitting: General Surgery

## 2012-11-19 ENCOUNTER — Ambulatory Visit (INDEPENDENT_AMBULATORY_CARE_PROVIDER_SITE_OTHER): Payer: BC Managed Care – PPO | Admitting: General Surgery

## 2012-11-19 ENCOUNTER — Encounter (HOSPITAL_COMMUNITY): Payer: Self-pay | Admitting: Pharmacy Technician

## 2012-11-19 VITALS — BP 122/74 | HR 76 | Resp 16 | Ht 69.0 in | Wt 194.8 lb

## 2012-11-19 DIAGNOSIS — K8020 Calculus of gallbladder without cholecystitis without obstruction: Secondary | ICD-10-CM

## 2012-11-19 NOTE — Progress Notes (Signed)
Patient ID: Michelle Knapp, female   DOB: 04/02/1990, 22 y.o.   MRN: [id number]  Chief Complaint  Patient presents with  . New Evaluation    eval GB    HPI Michelle Knapp is a 22 y.o. female.  HPI  This patient was referred by Dr. McGowan for evaluation of epigastric and chest pains which radiate to her back. She saw her physician and ultrasound and HIDA scan were ordered which confirmed cholelithiasis as well as biliary dyskinesia. She says that over the last 17 months she has had smaller attacks of this epigastric and chest pain which begins behind her breast bone and radiates to her back but she has had 2 main episodes since then with the latest episode occurring last week after eating [place] food. She says it usually starts in the evening and last several hours until it spontaneously resolve. She sits up in a chair to make her feel better although this really doesn't help. She denies any reflux. She does have some associated nausea but no fevers or chills or vomiting. She says that her bowels are normally irregular with intermittent diarrhea and constipation. She has had an ultrasound which confirmed cholelithiasis as well as a HIDA scan with a low ejection fraction   Past Medical History  Diagnosis Date  . 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 Anemia     Past Surgical History  Procedure Laterality Date  . Foot surgery  age [age] yrs    arches lowered on both feet: hx of both feet with chronic pain and episodes of turning blue    Family History  Problem Relation Age of Onset  . Fibromyalgia Mother   . Fibromyalgia Sister   . Diabetes Maternal Grandmother   . Diabetes Paternal Grandmother   . Cancer Paternal Grandfather     lungs    Social History History  Substance Use Topics  . Smoking status: Never Smoker   . Smokeless tobacco: Never Used  . Alcohol Use: Yes    No Known Allergies  Current Outpatient Prescriptions  Medication Sig Dispense Refill  .  SPRINTEC 28 0.25-35 MG-MCG tablet TAKE ONE TABLET BY MOUTH EVERY DAY  28 tablet  11   No current facility-administered medications for this visit.    Review of Systems Review of Systems All other review of systems negative or noncontributory except as stated in the HPI  Blood pressure 122/74, pulse 76, resp. rate 16, height 5' 9" (1.753 m), weight 194 lb 12.8 oz (88.361 kg), last menstrual period 10/06/2012.  Physical Exam Physical Exam Physical Exam  Nursing note and vitals reviewed. Constitutional: She is oriented to person, place, and time. She appears well-developed and well-nourished. No distress.  HENT:  Head: Normocephalic and atraumatic.  Mouth/Throat: No oropharyngeal exudate.  Eyes: Conjunctivae and EOM are normal. Pupils are equal, round, and reactive to light. Right eye exhibits no discharge. Left eye exhibits no discharge. No scleral icterus.  Neck: Normal range of motion. Neck supple. No tracheal deviation present.  Cardiovascular: Normal rate, regular rhythm, normal heart sounds and intact distal pulses.   Pulmonary/Chest: Effort normal and breath sounds normal. No stridor. No respiratory distress. She has no wheezes.  Abdominal: Soft. Bowel sounds are normal. She exhibits no distension and no mass. There is no tenderness. There is no rebound and no guarding.  Musculoskeletal: Normal range of motion. She exhibits no edema and no tenderness.  Neurological: She is alert and oriented to person,   place, and time.  Skin: Skin is warm and dry. No rash noted. She is not diaphoretic. No erythema. No pallor.  Psychiatric: She has a normal mood and affect. Her behavior is normal. Judgment and thought content normal.    Data Reviewed [place] and HIDA  Assessment    Symptomatic cholelithiasis I think that her symptoms do sound as though they are related to symptomatic cholelithiasis. She does have gallstones on ultrasound as well as an abnormal HIDA scan.  I discussed with her the  options of continued watchful waiting versus cholecystectomy and I have recommended cholecystectomy for treatment. Discussed with her the procedure and its risks. The risks of infection, bleeding, pain, persistent symptoms, scarring, injury to bowel or bile ducts, retained stone, diarrhea, need for additional procedures, and need for open surgery discussed with the patient.  She would like to go ahead and schedule cholecystectomy for possible relief      Plan    We will set her up for cholecystectomy when convenient        Uday Jantz DAVID 11/19/2012, 10:42 AM    

## 2012-11-21 ENCOUNTER — Encounter (HOSPITAL_COMMUNITY): Payer: Self-pay

## 2012-11-21 ENCOUNTER — Encounter (HOSPITAL_COMMUNITY)
Admission: RE | Admit: 2012-11-21 | Discharge: 2012-11-21 | Disposition: A | Discharge status: Home / Self Care | Payer: BC Managed Care – PPO | Source: Ambulatory Visit | Attending: General Surgery | Admitting: General Surgery

## 2012-11-21 VITALS — BP 124/83 | HR 79 | Temp 98.9°F | Resp 16 | Ht 69.0 in | Wt 196.0 lb

## 2012-11-21 DIAGNOSIS — Z01812 Encounter for preprocedural laboratory examination: Secondary | ICD-10-CM | POA: Insufficient documentation

## 2012-11-21 DIAGNOSIS — K8020 Calculus of gallbladder without cholecystitis without obstruction: Secondary | ICD-10-CM

## 2012-11-21 LAB — CBC WITH DIFFERENTIAL/PLATELET
Eosinophils Relative: 2 % (ref 0–5)
HCT: 42.3 % (ref 36.0–46.0)
Hemoglobin: 13.8 g/dL (ref 12.0–15.0)
Lymphocytes Relative: 35 % (ref 12–46)
Lymphs Abs: 1.9 10*3/uL (ref 0.7–4.0)
MCV: 88.5 fL (ref 78.0–100.0)
Monocytes Absolute: 0.5 10*3/uL (ref 0.1–1.0)
Monocytes Relative: 9 % (ref 3–12)
RBC: 4.78 MIL/uL (ref 3.87–5.11)
WBC: 5.5 10*3/uL (ref 4.0–10.5)

## 2012-11-21 LAB — COMPREHENSIVE METABOLIC PANEL
ALT: 16 U/L (ref 0–35)
CO2: 30 mEq/L (ref 19–32)
Calcium: 9.4 mg/dL (ref 8.4–10.5)
Creatinine, Ser: 0.9 mg/dL (ref 0.50–1.10)
GFR calc Af Amer: >90 mL/min (ref >90)
GFR calc non Af Amer: 89 mL/min — ABNORMAL LOW (ref >90)
Glucose, Bld: 87 mg/dL (ref 70–99)
Sodium: 141 mEq/L (ref 135–145)

## 2012-11-21 LAB — HCG, SERUM, QUALITATIVE: Preg, Serum: NEGATIVE

## 2012-11-21 NOTE — Patient Instructions (Signed)
20      Your procedure is scheduled on:  Tuesday 11/25/2012  Report to [facility] at 1130 AM.  Call this number if you have problems the night before or morning of surgery: [phone]   Remember:             IF YOU USE CPAP,BRING MASK AND TUBING AM OF SURGERY!   Do not eat food or drink liquids AFTER MIDNIGHT!  Take these medicines the morning of surgery with A SIP OF WATER: NONE   Do not bring valuables to the hospital. [facility] IS NOT RESPONSIBLE  FOR ANY BELONGINGS OR VALUABLES BROUGHT TO HOSPITAL.  Marland Kitchen  Leave suitcase in the car. After surgery it may be brought to your room.  For patients admitted to the hospital, checkout time is 11:00 AM the day of              Discharge.    DO NOT WEAR JEWELRY , MAKE-UP, LOTIONS,POWDERS,PERFUMES!             WOMEN -DO NOT SHAVE LEGS OR UNDERARMS 12 HRS. BEFORE  SURGERY!               MEN MAY SHAVE AS USUAL!             CONTACTS,DENTURES OR BRIDGEWORK, FALSE EYELASHES MAY NOT BE WORN INTO SURGERY!                                           Patients discharged the day of surgery will not be allowed to drive home. If going home the same day of surgery, must have someone stay with you first 24 hrs.at home and arrange for someone to drive you home from the Hospital.                          YOUR DRIVER IS: Clyde-spouse   Special Instructions:             Please read over the following fact sheets that you were given:             1. [facility] PREPARING FOR SURGERY SHEET              2.[facility]                                        Telford Nab.Lua Feng,RN,BSN     [id number]                FAILURE TO FOLLOW THESE INSTRUCTIONS MAY RESULT IN CANCELLATION OF YOUR SURGERY!               Patient Signature:___________________________

## 2012-11-25 ENCOUNTER — Encounter (HOSPITAL_COMMUNITY): Payer: Self-pay | Admitting: Anesthesiology

## 2012-11-25 ENCOUNTER — Ambulatory Visit (HOSPITAL_COMMUNITY): Payer: BC Managed Care – PPO

## 2012-11-25 ENCOUNTER — Ambulatory Visit (HOSPITAL_COMMUNITY)
Admission: RE | Admit: 2012-11-25 | Discharge: 2012-11-25 | Disposition: A | Discharge status: Home / Self Care | Payer: BC Managed Care – PPO | Source: Ambulatory Visit | Attending: General Surgery | Admitting: General Surgery

## 2012-11-25 ENCOUNTER — Encounter (HOSPITAL_COMMUNITY): Payer: Self-pay

## 2012-11-25 ENCOUNTER — Ambulatory Visit (HOSPITAL_COMMUNITY): Payer: BC Managed Care – PPO | Admitting: Anesthesiology

## 2012-11-25 ENCOUNTER — Encounter (HOSPITAL_COMMUNITY)
Admission: RE | Disposition: A | Discharge status: Home / Self Care | Payer: Self-pay | Source: Ambulatory Visit | Attending: General Surgery

## 2012-11-25 DIAGNOSIS — K8020 Calculus of gallbladder without cholecystitis without obstruction: Secondary | ICD-10-CM

## 2012-11-25 DIAGNOSIS — Z79899 Other long term (current) drug therapy: Secondary | ICD-10-CM | POA: Insufficient documentation

## 2012-11-25 DIAGNOSIS — D649 Anemia, unspecified: Secondary | ICD-10-CM | POA: Insufficient documentation

## 2012-11-25 DIAGNOSIS — K8010 Calculus of gallbladder with chronic cholecystitis without obstruction: Secondary | ICD-10-CM

## 2012-11-25 DIAGNOSIS — K828 Other specified diseases of gallbladder: Secondary | ICD-10-CM | POA: Insufficient documentation

## 2012-11-25 HISTORY — PX: CHOLECYSTECTOMY: SHX55

## 2012-11-25 SURGERY — LAPAROSCOPIC CHOLECYSTECTOMY WITH INTRAOPERATIVE CHOLANGIOGRAM
Anesthesia: General | Site: Abdomen | Wound class: Clean Contaminated

## 2012-11-25 MED ORDER — HYDROCODONE-ACETAMINOPHEN 5-325 MG PO TABS: 1.0000 | ORAL_TABLET | ORAL | Status: DC | PRN

## 2012-11-25 MED ORDER — LIDOCAINE-EPINEPHRINE (PF) 1 %-1:200000 IJ SOLN
INTRAMUSCULAR | Status: DC | PRN
  Administered 2012-11-25: 14 mL

## 2012-11-25 MED ORDER — LIDOCAINE-EPINEPHRINE (PF) 1 %-1:200000 IJ SOLN
INTRAMUSCULAR | Status: AC
  Filled 2012-11-25: qty 10

## 2012-11-25 MED ORDER — MIDAZOLAM HCL 5 MG/5ML IJ SOLN
INTRAMUSCULAR | Status: DC | PRN
  Administered 2012-11-25: 2 mg via INTRAVENOUS

## 2012-11-25 MED ORDER — LIDOCAINE HCL (PF) 2 % IJ SOLN
INTRAMUSCULAR | Status: DC | PRN
  Administered 2012-11-25: 75 mg

## 2012-11-25 MED ORDER — LACTATED RINGERS IV SOLN
INTRAVENOUS | Status: DC | PRN
  Administered 2012-11-25 (×2): via INTRAVENOUS

## 2012-11-25 MED ORDER — IOHEXOL 300 MG/ML  SOLN
INTRAMUSCULAR | Status: DC | PRN
  Administered 2012-11-25: 15 mL via INTRAVENOUS

## 2012-11-25 MED ORDER — PROMETHAZINE HCL 25 MG/ML IJ SOLN
6.2500 mg | INTRAMUSCULAR | Status: DC | PRN
  Administered 2012-11-25: 6.25 mg via INTRAVENOUS
  Filled 2012-11-25: qty 1

## 2012-11-25 MED ORDER — NEOSTIGMINE METHYLSULFATE 1 MG/ML IJ SOLN
INTRAMUSCULAR | Status: DC | PRN
  Administered 2012-11-25: 3.5 mg via INTRAVENOUS

## 2012-11-25 MED ORDER — LACTATED RINGERS IV SOLN: INTRAVENOUS | Status: DC

## 2012-11-25 MED ORDER — FENTANYL CITRATE 0.05 MG/ML IJ SOLN
25.0000 ug | INTRAMUSCULAR | Status: DC | PRN
  Administered 2012-11-25: 50 ug via INTRAVENOUS

## 2012-11-25 MED ORDER — BUPIVACAINE HCL (PF) 0.25 % IJ SOLN
INTRAMUSCULAR | Status: DC | PRN
  Administered 2012-11-25: 14 mL

## 2012-11-25 MED ORDER — ONDANSETRON HCL 4 MG/2ML IJ SOLN
INTRAMUSCULAR | Status: DC | PRN
  Administered 2012-11-25: 4 mg via INTRAVENOUS

## 2012-11-25 MED ORDER — DEXAMETHASONE SODIUM PHOSPHATE 10 MG/ML IJ SOLN
INTRAMUSCULAR | Status: DC | PRN
  Administered 2012-11-25: 5 mg via INTRAVENOUS

## 2012-11-25 MED ORDER — FENTANYL CITRATE 0.05 MG/ML IJ SOLN
INTRAMUSCULAR | Status: DC | PRN
  Administered 2012-11-25: 100 ug via INTRAVENOUS
  Administered 2012-11-25: 50 ug via INTRAVENOUS
  Administered 2012-11-25 (×2): 100 ug via INTRAVENOUS

## 2012-11-25 MED ORDER — ROCURONIUM BROMIDE 100 MG/10ML IV SOLN
INTRAVENOUS | Status: DC | PRN
  Administered 2012-11-25: 20 mg via INTRAVENOUS
  Administered 2012-11-25: 50 mg via INTRAVENOUS
  Administered 2012-11-25: 10 mg via INTRAVENOUS

## 2012-11-25 MED ORDER — FENTANYL CITRATE 0.05 MG/ML IJ SOLN
INTRAMUSCULAR | Status: AC
  Filled 2012-11-25: qty 2

## 2012-11-25 MED ORDER — CEFAZOLIN SODIUM-DEXTROSE 2-3 GM-% IV SOLR
2.0000 g | INTRAVENOUS | Status: AC
  Administered 2012-11-25: 2 g via INTRAVENOUS

## 2012-11-25 MED ORDER — IOHEXOL 300 MG/ML  SOLN
INTRAMUSCULAR | Status: AC
  Filled 2012-11-25: qty 1

## 2012-11-25 MED ORDER — PROPOFOL 10 MG/ML IV BOLUS
INTRAVENOUS | Status: DC | PRN
  Administered 2012-11-25: 200 mg via INTRAVENOUS

## 2012-11-25 MED ORDER — GLYCOPYRROLATE 0.2 MG/ML IJ SOLN
INTRAMUSCULAR | Status: DC | PRN
  Administered 2012-11-25: 0.4 mg via INTRAVENOUS

## 2012-11-25 MED ORDER — CEFAZOLIN SODIUM-DEXTROSE 2-3 GM-% IV SOLR
INTRAVENOUS | Status: AC
  Filled 2012-11-25: qty 50

## 2012-11-25 MED ORDER — LACTATED RINGERS IV SOLN
INTRAVENOUS | Status: DC | PRN
  Administered 2012-11-25: 1000 mL via INTRAVENOUS

## 2012-11-25 MED ORDER — BUPIVACAINE HCL (PF) 0.25 % IJ SOLN
INTRAMUSCULAR | Status: AC
  Filled 2012-11-25: qty 30

## 2012-11-25 SURGICAL SUPPLY — 41 items
APPLICATOR COTTON TIP 6IN STRL (MISCELLANEOUS) IMPLANT
APPLIER CLIP LOGIC TI 5 (MISCELLANEOUS) IMPLANT
APPLIER CLIP ROT 10 11.4 M/L (STAPLE) ×2
BLADE HEX COATED 2.75 (ELECTRODE) ×2 IMPLANT
CABLE HIGH FREQUENCY MONO STRZ (ELECTRODE) ×2 IMPLANT
CANISTER SUCTION 2500CC (MISCELLANEOUS) ×2 IMPLANT
CATH REDDICK CHOLANGI 4FR 50CM (CATHETERS) IMPLANT
CHLORAPREP W/TINT 26ML (MISCELLANEOUS) ×4 IMPLANT
CLIP APPLIE ROT 10 11.4 M/L (STAPLE) ×1 IMPLANT
CLOTH BEACON ORANGE TIMEOUT ST (SAFETY) ×2 IMPLANT
DECANTER SPIKE VIAL GLASS SM (MISCELLANEOUS) ×2 IMPLANT
DERMABOND ADVANCED (GAUZE/BANDAGES/DRESSINGS) ×1
DERMABOND ADVANCED .7 DNX12 (GAUZE/BANDAGES/DRESSINGS) ×1 IMPLANT
DRAPE C-ARM 42X120 X-RAY (DRAPES) IMPLANT
DRAPE LAPAROSCOPIC ABDOMINAL (DRAPES) ×2 IMPLANT
DRAPE UTILITY XL STRL (DRAPES) ×2 IMPLANT
ELECT REM PT RETURN 9FT ADLT (ELECTROSURGICAL) ×2
ELECTRODE REM PT RTRN 9FT ADLT (ELECTROSURGICAL) ×1 IMPLANT
ENDOLOOP SUT PDS II  0 18 (SUTURE) ×1
ENDOLOOP SUT PDS II 0 18 (SUTURE) ×1 IMPLANT
GLOVE SURG SS PI 7.5 STRL IVOR (GLOVE) ×4 IMPLANT
GOWN STRL REIN XL XLG (GOWN DISPOSABLE) ×4 IMPLANT
KIT BASIN OR (CUSTOM PROCEDURE TRAY) ×2 IMPLANT
NS IRRIG 1000ML POUR BTL (IV SOLUTION) ×2 IMPLANT
PENCIL BUTTON HOLSTER BLD 10FT (ELECTRODE) ×2 IMPLANT
POUCH SPECIMEN RETRIEVAL 10MM (ENDOMECHANICALS) ×2 IMPLANT
SCISSORS LAP 5X35 DISP (ENDOMECHANICALS) ×2 IMPLANT
SET CHOLANGIOGRAPH MIX (MISCELLANEOUS) ×2 IMPLANT
SET IRRIG TUBING LAPAROSCOPIC (IRRIGATION / IRRIGATOR) ×2 IMPLANT
STRIP CLOSURE SKIN 1/2X4 (GAUZE/BANDAGES/DRESSINGS) IMPLANT
SUT MNCRL AB 4-0 PS2 18 (SUTURE) ×4 IMPLANT
SUT VICRYL 0 UR6 27IN ABS (SUTURE) ×2 IMPLANT
SYR 20CC LL (SYRINGE) ×2 IMPLANT
TOWEL OR 17X26 10 PK STRL BLUE (TOWEL DISPOSABLE) ×2 IMPLANT
TOWEL OR NON WOVEN STRL DISP B (DISPOSABLE) ×2 IMPLANT
TRAY LAP CHOLE (CUSTOM PROCEDURE TRAY) ×2 IMPLANT
TROCAR BALLN 12MMX100 BLUNT (TROCAR) ×2 IMPLANT
TROCAR BLADELESS OPT 5 75 (ENDOMECHANICALS) ×2 IMPLANT
TROCAR SLEEVE XCEL 5X75 (ENDOMECHANICALS) ×2 IMPLANT
TROCAR XCEL NON-BLD 11X100MML (ENDOMECHANICALS) ×2 IMPLANT
TUBING INSUFFLATION 10FT LAP (TUBING) ×2 IMPLANT

## 2012-11-25 NOTE — Progress Notes (Signed)
States nausea better. But is concerned because lives far away and pharmacy isn't opened 24 hrs. Call into MD

## 2012-11-25 NOTE — Interval H&P Note (Signed)
History and Physical Interval Note:  11/25/2012 1:36 PM  Michelle Knapp  has presented today for surgery, with the diagnosis of cholelithiasis  The various methods of treatment have been discussed with the patient and family. After consideration of risks, benefits and other options for treatment, the patient has consented to  Procedure(s): LAPAROSCOPIC CHOLECYSTECTOMY WITH INTRAOPERATIVE CHOLANGIOGRAM (N/A) as a surgical intervention .  The patient's history has been reviewed, patient examined, no change in status, stable for surgery.  I have reviewed the patient's chart and labs.  Questions were answered to the patient's satisfaction.  She was seen and evaluated in the preop area.  Risks of the procedure again discussed with her in lay terms.  The risks of infection, bleeding, pain, persistent symptoms, scarring, injury to bowel or bile ducts, retained stone, diarrhea, need for additional procedures, and need for open surgery discussed with the patient.  She desires to proceed with lap chole/IOC/possible open.    Lodema Pilot DAVID

## 2012-11-25 NOTE — Brief Op Note (Signed)
11/25/2012  3:36 PM  PATIENT:  Michelle Knapp  22 y.o. female  PRE-OPERATIVE DIAGNOSIS:  cholelithiasis  POST-OPERATIVE DIAGNOSIS:  cholelithasis  PROCEDURE:  Procedure(s): LAPAROSCOPIC CHOLECYSTECTOMY WITH INTRAOPERATIVE CHOLANGIOGRAM (N/A)  SURGEON:  Surgeon(s) and Role:    * Lodema Pilot, DO - Primary  PHYSICIAN ASSISTANT:   ASSISTANTS: none   ANESTHESIA:   general  EBL:  Total I/O In: 1200 [I.V.:1200] Out: -   BLOOD ADMINISTERED:none  DRAINS: none   LOCAL MEDICATIONS USED:  MARCAINE    and LIDOCAINE   SPECIMEN:  Source of Specimen:  gallbladder  DISPOSITION OF SPECIMEN:  PATHOLOGY  COUNTS:  YES  TOURNIQUET:  * No tourniquets in log *  DICTATION: .Other Dictation: Dictation Number dictated  PLAN OF CARE: Discharge to home after PACU  PATIENT DISPOSITION:  PACU - hemodynamically stable.   Delay start of Pharmacological VTE agent (>24hrs) due to surgical blood loss or risk of bleeding: no

## 2012-11-25 NOTE — H&P (View-Only) (Signed)
Patient ID: Michelle Knapp, female   DOB: April 08, 1990, 22 y.o.   MRN: [id number]  Chief Complaint  Patient presents with  . New Evaluation    eval GB    HPI Michelle Knapp is a 22 y.o. female.  HPI  This patient was referred by Dr. Marvel Plan for evaluation of epigastric and chest pains which radiate to her back. She saw her physician and ultrasound and HIDA scan were ordered which confirmed cholelithiasis as well as biliary dyskinesia. She says that over the last 17 months she has had smaller attacks of this epigastric and chest pain which begins behind her breast bone and radiates to her back but she has had 2 main episodes since then with the latest episode occurring last week after eating [place] food. She says it usually starts in the evening and last several hours until it spontaneously resolve. She sits up in a chair to make her feel better although this really doesn't help. She denies any reflux. She does have some associated nausea but no fevers or chills or vomiting. She says that her bowels are normally irregular with intermittent diarrhea and constipation. She has had an ultrasound which confirmed cholelithiasis as well as a HIDA scan with a low ejection fraction   Past Medical History  Diagnosis Date  . 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 Anemia     Past Surgical History  Procedure Laterality Date  . Foot surgery  age [age] yrs    arches lowered on both feet: hx of both feet with chronic pain and episodes of turning blue    Family History  Problem Relation Age of Onset  . Fibromyalgia Mother   . Fibromyalgia Sister   . Diabetes Maternal Grandmother   . Diabetes Paternal Grandmother   . Cancer Paternal Grandfather     lungs    Social History History  Substance Use Topics  . Smoking status: Never Smoker   . Smokeless tobacco: Never Used  . Alcohol Use: Yes    No Known Allergies  Current Outpatient Prescriptions  Medication Sig Dispense Refill  .  SPRINTEC 28 0.25-35 MG-MCG tablet TAKE ONE TABLET BY MOUTH EVERY DAY  28 tablet  11   No current facility-administered medications for this visit.    Review of Systems Review of Systems All other review of systems negative or noncontributory except as stated in the HPI  Blood pressure 122/74, pulse 76, resp. rate 16, height 5\' 9"  (1.753 m), weight 194 lb 12.8 oz (88.361 kg), last menstrual period 10/06/2012.  Physical Exam Physical Exam Physical Exam  Nursing note and vitals reviewed. Constitutional: She is oriented to person, place, and time. She appears well-developed and well-nourished. No distress.  HENT:  Head: Normocephalic and atraumatic.  Mouth/Throat: No oropharyngeal exudate.  Eyes: Conjunctivae and EOM are normal. Pupils are equal, round, and reactive to light. Right eye exhibits no discharge. Left eye exhibits no discharge. No scleral icterus.  Neck: Normal range of motion. Neck supple. No tracheal deviation present.  Cardiovascular: Normal rate, regular rhythm, normal heart sounds and intact distal pulses.   Pulmonary/Chest: Effort normal and breath sounds normal. No stridor. No respiratory distress. She has no wheezes.  Abdominal: Soft. Bowel sounds are normal. She exhibits no distension and no mass. There is no tenderness. There is no rebound and no guarding.  Musculoskeletal: Normal range of motion. She exhibits no edema and no tenderness.  Neurological: She is alert and oriented to person,  place, and time.  Skin: Skin is warm and dry. No rash noted. She is not diaphoretic. No erythema. No pallor.  Psychiatric: She has a normal mood and affect. Her behavior is normal. Judgment and thought content normal.    Data Reviewed [place] and HIDA  Assessment    Symptomatic cholelithiasis I think that her symptoms do sound as though they are related to symptomatic cholelithiasis. She does have gallstones on ultrasound as well as an abnormal HIDA scan.  I discussed with her the  options of continued watchful waiting versus cholecystectomy and I have recommended cholecystectomy for treatment. Discussed with her the procedure and its risks. The risks of infection, bleeding, pain, persistent symptoms, scarring, injury to bowel or bile ducts, retained stone, diarrhea, need for additional procedures, and need for open surgery discussed with the patient.  She would like to go ahead and schedule cholecystectomy for possible relief      Plan    We will set her up for cholecystectomy when convenient        Michelle Knapp DAVID 11/19/2012, 10:42 AM

## 2012-11-25 NOTE — Transfer of Care (Signed)
Immediate Anesthesia Transfer of Care Note  Patient: Michelle Knapp  Procedure(s) Performed: Procedure(s): LAPAROSCOPIC CHOLECYSTECTOMY WITH INTRAOPERATIVE CHOLANGIOGRAM (N/A)  Patient Location: PACU  Anesthesia Type:General  Level of Consciousness: sedated  Airway & Oxygen Therapy: Patient Spontanous Breathing and Patient connected to face mask oxygen  Post-op Assessment: Report given to PACU RN and Post -op Vital signs reviewed and stable  Post vital signs: Reviewed and stable  Complications: No apparent anesthesia complications

## 2012-11-25 NOTE — Progress Notes (Signed)
C/o nausea w some dry heaves. Call to Dr Leta Jungling- anesthesia

## 2012-11-25 NOTE — Anesthesia Postprocedure Evaluation (Signed)
  Anesthesia Post-op Note  Patient: Michelle Knapp  Procedure(s) Performed: Procedure(s) (LRB): LAPAROSCOPIC CHOLECYSTECTOMY WITH INTRAOPERATIVE CHOLANGIOGRAM (N/A)  Patient Location: PACU  Anesthesia Type: General  Level of Consciousness: awake and alert   Airway and Oxygen Therapy: Patient Spontanous Breathing  Post-op Pain: mild  Post-op Assessment: Post-op Vital signs reviewed, Patient's Cardiovascular Status Stable, Respiratory Function Stable, Patent Airway and No signs of Nausea or vomiting  Last Vitals:  Filed Vitals:   11/25/12 1600  BP: 129/63  Pulse: 68  Temp: 36.9 C  Resp: 18    Post-op Vital Signs: stable   Complications: No apparent anesthesia complications

## 2012-11-25 NOTE — Anesthesia Preprocedure Evaluation (Signed)

## 2012-11-25 NOTE — Progress Notes (Signed)
Dr Biagio Quint spoke w patient. She will call his office in the am if still having nausea, per his instructions

## 2012-11-26 ENCOUNTER — Encounter (HOSPITAL_COMMUNITY): Payer: Self-pay | Admitting: General Surgery

## 2012-11-26 ENCOUNTER — Other Ambulatory Visit (INDEPENDENT_AMBULATORY_CARE_PROVIDER_SITE_OTHER): Payer: Self-pay

## 2012-11-26 DIAGNOSIS — Z9049 Acquired absence of other specified parts of digestive tract: Secondary | ICD-10-CM

## 2012-11-26 NOTE — Op Note (Signed)
Michelle Knapp, Michelle Knapp NO.:  [id number]  MEDICAL RECORD NO.:  [id number]  LOCATION:  [facility]                         FACILITY:  [facility]  PHYSICIAN:  Lodema Pilot, MD       DATE OF BIRTH:  22-Oct-1990  DATE OF PROCEDURE:  11/25/2012 DATE OF DISCHARGE:  11/25/2012                              OPERATIVE REPORT   PROCEDURE:  Laparoscopic cholecystectomy with intraoperative cholangiogram.  PREOPERATIVE DIAGNOSIS:  Symptomatic cholelithiasis.  POSTOPERATIVE DIAGNOSIS:  Symptomatic cholelithiasis.  SURGEON:  Lodema Pilot, MD  ASSISTANT:  None.  ANESTHESIA:  General endotracheal tube anesthesia with 40 mL of 1% lidocaine with epinephrine and 0.25% Marcaine in a 50:50 mixture.  FLUIDS:  1200 mL of crystalloid.  ESTIMATED BLOOD LOSS:  Minimal.  DRAINS:  None.  SPECIMENS:  Gallbladder and contents sent to Pathology for permanent section.  COMPLICATIONS:  None apparent.  FINDINGS:  Normal-appearing gallbladder anatomy.  All of the gallbladder was intrahepatic.  Multiple gallstones.  Questionable cholangiogram for possible retained stone versus air bubble, although the final run of the cholangiogram appeared normal and no evidence of retained gallstones.  INDICATION FOR PROCEDURE:  Ms. Gwynne is a 22 year old female with gallstones on ultrasound as well as abnormal HIDA scan and right upper quadrant abdominal pain concerning for symptomatic cholelithiasis.  OPERATIVE DETAILS:  Ms. Grindle was seen and evaluated in the preoperative area and risks and benefits of procedure were discussed again in lay terms.  Informed consent was obtained.  She was given prophylactic antibiotics and taken to the operating room, placed on the table in supine position.  General endotracheal tube anesthesia was obtained and her abdomen was prepped and draped in a standard fashion.  Procedure time-out was performed with all operative team members to confirm proper patient, procedure, and a  semicircular infraumbilical incision was made in the skin and dissection carried down to the subcu tissue with blunt dissection.  The umbilical stalk was elevated and sharply incised and the peritoneum entered bluntly.  A 12-mm balloon port was placed at the umbilicus and pneumoperitoneum was obtained.  Laparoscope was introduced, there was no evidence of bleeding or bowel injury and additional trocars were placed.  An 11 mm trocar was placed in the epigastrium and two 5 mm right lateral abdominal trocars were placed under direct visualization.  She had a very large and floppy liver and the gallbladder was intrahepatic, which limited some of the visibility during the dissection, but as I stated, the liver was fairly floppy and I could retract it in order to visualize the necessary anatomy.  I took down the peritoneum from the gallbladder and identified the artery on the medial aspect of the cystic duct and the artery was skeletonized and clipped and divided between hemoclips.  I then opened up the triangle of Calot and skeletonized the cystic duct.  I was able to visualize the cystic duct as it entered the gallbladder and this critical view of safety visualizing the liver and parenchyma through the triangle of Calot.  I put a clip on the gallbladder side of the duct and partially divided the duct.  Cholangiogram catheter was passed through separate stab incisions.  I  was able to visualize the long cystic duct and the normal right and left hepatic ducts and free flow of bile into the duodenum.  On the second runoff cholangiogram video, there was question as to whether there was a possible gallstone versus air bubbles in the cystic duct and I put her in a Trendelenburg position, and repeated the cholangiograms.  On the cholangiogram, I did not see the air bubble or possible gallstones and Dr. Margo Aye with Radiology actually evaluated the images and he also felt that this was most likely due to  an air bubble. The catheter was removed and the duct was clipped and an PDS endo-loop was also placed for additional security.  The gallbladder was then removed from the gallbladder fossa using Bovie electrocautery.  The gallbladder was not entered during the dissection.  Again she had intrahepatic gallbladder and I had to retract similarly all the way to remove the gallbladder, but this was done safely and again the gallbladder was not entered during the dissection.  The gallbladder was placed in the Endocatch bag and removed through the umbilical trocar site, sent to Pathology for permanent section.  The right upper quadrant was irrigated with sterile saline solution and fluid was suctioned.  The gallbladder fossa was inspected for hemostasis, which was noted to be adequate and the umbilical fascia was approximated with interrupted 0- Vicryl sutures.  The abdomen was re-insufflated with gas through the epigastric ports and the umbilical closure was noted to be adequate without any evidence of bleeding or bowel injury and the final trocars were removed under direct visualization.  The abdominal wall was noted to be hemostatic.  The right upper quadrant was again noted to be hemostatic without any evidence of bleeding or bowel injury or bile leakage.  The wounds were injected with 40 mL of 1% lidocaine with epinephrine and 0.25% Marcaine in a 50:50 mixture, and the skin edges were approximated with 4-0 Monocryl subcuticular suture.  Skin was washed and dried and Dermabond was applied.  All sponge, needle, and instrument counts were correct at the end of the case and the patient tolerated procedure well without apparent complication.          ______________________________ Lodema Pilot, MD     BL/MEDQ  D:  11/25/2012  T:  11/26/2012  Job:  [id number]

## 2012-11-27 ENCOUNTER — Telehealth (INDEPENDENT_AMBULATORY_CARE_PROVIDER_SITE_OTHER): Payer: Self-pay | Admitting: General Surgery

## 2012-11-27 ENCOUNTER — Telehealth (INDEPENDENT_AMBULATORY_CARE_PROVIDER_SITE_OTHER): Payer: Self-pay

## 2012-11-27 DIAGNOSIS — R110 Nausea: Secondary | ICD-10-CM

## 2012-11-27 MED ORDER — ONDANSETRON 4 MG PO TBDP: 4.0000 mg | ORAL_TABLET | Freq: Three times a day (TID) | ORAL | Status: DC | PRN

## 2012-11-27 NOTE — Telephone Encounter (Signed)
Mailed orders for lab work (LRT's) per Dr. Biagio Quint for patient to have drawn the week prior to appointment on 12/11/12 w/Dr. Biagio Quint

## 2012-11-27 NOTE — Telephone Encounter (Signed)
I called the patient to see how she was doing.  She says that she is doing okay and seems to be improving every day.

## 2012-11-27 NOTE — Telephone Encounter (Signed)
Pt's mother calling in requesting a nausea rx for the pt that just had her lap chole on 9/2 by Dr Biagio Quint. I advised pt per protocol that I could call in one rx for nausea Zofran 4mg  #15 eprescribed to [facility] per pt's request. I advised pt that if the nausea is not better by tomorrow to call our office on Friday to let us know. The pt also stated that she is constipated so I advised pt to get Milk of Magnesia otc to take along with drinking plenty of fluids. The pt understands.

## 2012-12-03 ENCOUNTER — Encounter: Payer: Self-pay | Admitting: Family Medicine

## 2012-12-03 LAB — HEPATIC FUNCTION PANEL
Alkaline Phosphatase: 56 U/L (ref 39–117)
Bilirubin, Direct: 0.1 mg/dL (ref 0.0–0.3)
Indirect Bilirubin: 0.3 mg/dL (ref 0.0–0.9)
Total Bilirubin: 0.4 mg/dL (ref 0.3–1.2)

## 2012-12-11 ENCOUNTER — Encounter (INDEPENDENT_AMBULATORY_CARE_PROVIDER_SITE_OTHER): Payer: BC Managed Care – PPO | Admitting: General Surgery

## 2012-12-11 ENCOUNTER — Telehealth (INDEPENDENT_AMBULATORY_CARE_PROVIDER_SITE_OTHER): Payer: Self-pay

## 2012-12-11 NOTE — Telephone Encounter (Signed)
Patient came to office today to be seen by Dr. Biagio Quint.  Multiple attempts were made to contact patient at [phone] @ 10:48 & 10:49 am and I called [phone] @ 10:49 am.  Left message for patient to call our office regarding rescheduling her appointment for today due to Dr. Delice Lesch office being cxl'd.  Patient states she was at a Dr's appointment and did not get a message from me.  Patient was offered appointment on 12/12/12 or 9/25, 12/19/12 with Dr. Biagio Quint but declined.  Patient states she will call our office to schedule at her convenience.

## 2012-12-11 NOTE — Telephone Encounter (Signed)
Called and left message for patient to call our office regarding her appointment for today need's to be rescheduled due to office being cxl'd per Dr. Biagio Quint.  Patient can be rescheduled to 12/18/12 @ 9:45 am or 10:30am.

## 2012-12-26 ENCOUNTER — Telehealth (INDEPENDENT_AMBULATORY_CARE_PROVIDER_SITE_OTHER): Payer: Self-pay | Admitting: General Surgery

## 2012-12-26 NOTE — Telephone Encounter (Signed)
Called and left message for patient. RE:  Per Dr. Biagio Quint patient does not need to come in to office unless she's having problems.  Patient pathology was normal and recent LFT's were also normal.  Patient can call on a prn basis.

## 2012-12-26 NOTE — Telephone Encounter (Signed)
Please call patient she wants to know if she needs to come in to see Dr Biagio Quint, she is aware that her lab work is normal. She just wants to know if she needs a apt

## 2013-07-02 ENCOUNTER — Ambulatory Visit (INDEPENDENT_AMBULATORY_CARE_PROVIDER_SITE_OTHER): Payer: 59 | Admitting: Family Medicine

## 2013-07-02 ENCOUNTER — Encounter: Payer: Self-pay | Admitting: Family Medicine

## 2013-07-02 VITALS — BP 138/88 | HR 84 | Temp 98.0°F | Resp 18 | Ht 69.0 in | Wt 218.0 lb

## 2013-07-02 DIAGNOSIS — H6690 Otitis media, unspecified, unspecified ear: Secondary | ICD-10-CM

## 2013-07-02 DIAGNOSIS — J069 Acute upper respiratory infection, unspecified: Secondary | ICD-10-CM

## 2013-07-02 MED ORDER — AMOXICILLIN 875 MG PO TABS: 875.0000 mg | ORAL_TABLET | Freq: Two times a day (BID) | ORAL | Status: AC

## 2013-07-02 NOTE — Progress Notes (Signed)
Pre visit review using our clinic review tool, if applicable. No additional management support is needed unless otherwise documented below in the visit note. 

## 2013-07-02 NOTE — Progress Notes (Signed)
OFFICE NOTE  07/02/2013  CC:  Chief Complaint  Patient presents with  . Ear Fullness    esp Left ear  . Otalgia  . Nasal Congestion     HPI: Patient is a 23 y.o. Caucasian female who is here for resp sx's. Onset 3 d/a, nasal congestion, mild ST, cough, ear fullness. Last night ear pain got worse-sharp.  Alleve D helpful. No ear drainage.  Subjective fever on first night of illness. Losing voice a little.  Now throat doesn't hurt but she feels tickly and PND. No wheezing or chest tightness or SOB.   Pertinent PMH:  Past medical, surgical, social, and family history reviewed and no changes are noted since last office visit.  MEDS:  Sprintec 28, 1 tab po qd  PE: Blood pressure 138/88, pulse 84, temperature 98 F (36.7 C), temperature source Temporal, resp. rate 18, height 5\' 9"  (1.753 m), weight 218 lb (98.884 kg), last menstrual period 06/18/2013, SpO2 100.00%. VS: noted--normal. Gen: alert, NAD, NONTOXIC APPEARING. HEENT: eyes without injection, drainage, or swelling.  Ears: EACs clear, Right TM with normal light reflex and landmarks but retracted.  Left TM with bulging and erythema, middle ear pus visible.  TM is intact.  Nose: Clear rhinorrhea, with some dried, crusty exudate adherent to mildly injected mucosa.  No purulent d/c.  No paranasal sinus TTP.  No facial swelling.  Throat and mouth without focal lesion.  No pharyngial swelling, erythema, or exudate.   Neck: supple, no LAD.   LUNGS: CTA bilat, nonlabored resps.   CV: RRR, no m/r/g. EXT: no c/c/e SKIN: no rash  IMPRESSION AND PLAN:  Viral URI, with left AOM. Amoxil 875mg  bid x 7d. Trial of mucinex DM or robitussin DM otc as directed on the box. May use OTC nasal saline spray or irrigation solution bid. OTC nonsedating antihistamines prn discussed.  Decongestant use discussed--ok if tolerated in the past w/out side effect and if pt has no hx of HTN.  An After Visit Summary was printed and given to the  patient.  FOLLOW UP: prn

## 2013-07-02 NOTE — Patient Instructions (Signed)
Mucinex DM for cough (OTC). Saline nasal spray (OTC, generic) as needed. Alleve or tylenol as needed.

## 2013-07-08 ENCOUNTER — Other Ambulatory Visit: Payer: Self-pay | Admitting: Obstetrics and Gynecology

## 2013-07-09 ENCOUNTER — Telehealth: Payer: Self-pay

## 2013-07-09 MED ORDER — CEFDINIR 300 MG PO CAPS: 300.0000 mg | ORAL_CAPSULE | Freq: Two times a day (BID) | ORAL | Status: DC

## 2013-07-09 NOTE — Telephone Encounter (Signed)
Pt called and stated she was seen for an ear infection last week and is taking her antibiotic. She feels she is not getting better. It feels like she is under water and there is still pain in her ear. She wants to know if she should try another medication. Please advise

## 2013-07-09 NOTE — Telephone Encounter (Signed)
Spoke with pt, advised message from Dr Anitra Lauth. Pt understood. Sent in Rx to her pharmacy.

## 2013-07-09 NOTE — Telephone Encounter (Signed)
Pls advise pt to stop the amoxicillin she is currently on. Pls send in eRx for cefdinir (generic omnicef) 300 mg caps, 1 cap po bid x 7d, #14, no RF. If not improved at the end of this 7d course of cefdinir then I recommend she come back for recheck.-thx

## 2013-09-07 ENCOUNTER — Telehealth: Payer: Self-pay | Admitting: Women's Health

## 2013-09-07 NOTE — Telephone Encounter (Signed)
Pt states that she has been having some dryness and some yellowish discharge that comes and goes, pt denies any odor. Pt states that last week she had it. Pt states that she is not having any of these issues now. I advised the pt to keep an eye on things and to call us back if the symptoms came back or got worse, I also advised the pt to push her fluids. Pt verbalized understanding.

## 2013-10-01 ENCOUNTER — Ambulatory Visit (INDEPENDENT_AMBULATORY_CARE_PROVIDER_SITE_OTHER): Payer: 59 | Admitting: Obstetrics and Gynecology

## 2013-10-01 DIAGNOSIS — Z3201 Encounter for pregnancy test, result positive: Secondary | ICD-10-CM

## 2013-10-01 DIAGNOSIS — Z3200 Encounter for pregnancy test, result unknown: Secondary | ICD-10-CM

## 2013-10-01 LAB — POCT URINE PREGNANCY: PREG TEST UR: POSITIVE

## 2013-10-01 NOTE — Progress Notes (Signed)
Patient ID: Michelle Knapp, female   DOB: [birth year], 23 y.o.   MRN: [id number] Pt here today for UPT, which is positive. EDD by LMP is 05/17/14. Pt mentioned some mild nausea but wants to wait on any meds. Pt to come back in 1-2 weeks for New OB visit and [place].

## 2013-10-02 ENCOUNTER — Other Ambulatory Visit: Payer: Self-pay | Admitting: Obstetrics and Gynecology

## 2013-10-02 DIAGNOSIS — O3680X1 Pregnancy with inconclusive fetal viability, fetus 1: Secondary | ICD-10-CM

## 2013-10-07 ENCOUNTER — Ambulatory Visit (INDEPENDENT_AMBULATORY_CARE_PROVIDER_SITE_OTHER): Payer: 59

## 2013-10-07 DIAGNOSIS — O3680X1 Pregnancy with inconclusive fetal viability, fetus 1: Secondary | ICD-10-CM

## 2013-10-07 DIAGNOSIS — O3680X0 Pregnancy with inconclusive fetal viability, not applicable or unspecified: Secondary | ICD-10-CM

## 2013-10-07 NOTE — Progress Notes (Signed)
U/S(8+2wks)-single IUP with +FCA noted, FHR- 172 bpm, CRL c/w LMP dates, cx appears closed, bilateral adnexa appears WNL

## 2013-10-21 ENCOUNTER — Ambulatory Visit (INDEPENDENT_AMBULATORY_CARE_PROVIDER_SITE_OTHER): Payer: 59 | Admitting: Women's Health

## 2013-10-21 ENCOUNTER — Encounter: Payer: Self-pay | Admitting: Women's Health

## 2013-10-21 ENCOUNTER — Other Ambulatory Visit (HOSPITAL_COMMUNITY)
Admission: RE | Admit: 2013-10-21 | Discharge: 2013-10-21 | Disposition: A | Discharge status: Home / Self Care | Payer: 59 | Source: Ambulatory Visit | Attending: Obstetrics & Gynecology | Admitting: Obstetrics & Gynecology

## 2013-10-21 VITALS — BP 118/62 | Wt 217.0 lb

## 2013-10-21 DIAGNOSIS — Z113 Encounter for screening for infections with a predominantly sexual mode of transmission: Secondary | ICD-10-CM | POA: Insufficient documentation

## 2013-10-21 DIAGNOSIS — Z01419 Encounter for gynecological examination (general) (routine) without abnormal findings: Secondary | ICD-10-CM

## 2013-10-21 DIAGNOSIS — Z3480 Encounter for supervision of other normal pregnancy, unspecified trimester: Secondary | ICD-10-CM | POA: Insufficient documentation

## 2013-10-21 DIAGNOSIS — Z3481 Encounter for supervision of other normal pregnancy, first trimester: Secondary | ICD-10-CM

## 2013-10-21 DIAGNOSIS — Z1389 Encounter for screening for other disorder: Secondary | ICD-10-CM

## 2013-10-21 DIAGNOSIS — Z331 Pregnant state, incidental: Secondary | ICD-10-CM

## 2013-10-21 LAB — CBC
HCT: 42.5 % (ref 36.0–46.0)
Hemoglobin: 14.7 g/dL (ref 12.0–15.0)
MCH: 29.2 pg (ref 26.0–34.0)
MCHC: 34.6 g/dL (ref 30.0–36.0)
MCV: 84.5 fL (ref 78.0–100.0)
PLATELETS: 241 10*3/uL (ref 150–400)
RBC: 5.03 MIL/uL (ref 3.87–5.11)
RDW: 14.3 % (ref 11.5–15.5)
WBC: 9.6 10*3/uL (ref 4.0–10.5)

## 2013-10-21 LAB — POCT URINALYSIS DIPSTICK
Glucose, UA: NEGATIVE
Ketones, UA: NEGATIVE
Leukocytes, UA: NEGATIVE
NITRITE UA: NEGATIVE
Protein, UA: NEGATIVE
RBC UA: NEGATIVE

## 2013-10-21 NOTE — Patient Instructions (Signed)
Nausea & Vomiting  Have saltine crackers or pretzels by your bed and eat a few bites before you raise your head out of bed in the morning  Eat small frequent meals throughout the day instead of large meals  Drink plenty of fluids throughout the day to stay hydrated, just don't drink a lot of fluids with your meals.  This can make your stomach fill up faster making you feel sick  Do not brush your teeth right after you eat  Products with real ginger are good for nausea, like ginger ale and ginger hard candy Make sure it says made with real ginger!  Sucking on sour candy like lemon heads is also good for nausea  If your prenatal vitamins make you nauseated, take them at night so you will sleep through the nausea  If you feel like you need medicine for the nausea & vomiting please let us know  If you are unable to keep any fluids or food down please let us know    First Trimester of Pregnancy The first trimester of pregnancy is from week 1 until the end of week 12 (months 1 through 3). A week after a sperm fertilizes an egg, the egg will implant on the wall of the uterus. This embryo will begin to develop into a baby. Genes from you and your partner are forming the baby. The female genes determine whether the baby is a boy or a girl. At 6-8 weeks, the eyes and face are formed, and the heartbeat can be seen on ultrasound. At the end of 12 weeks, all the baby's organs are formed.  Now that you are pregnant, you will want to do everything you can to have a healthy baby. Two of the most important things are to get good prenatal care and to follow your health care provider's instructions. Prenatal care is all the medical care you receive before the baby's birth. This care will help prevent, find, and treat any problems during the pregnancy and childbirth. BODY CHANGES Your body goes through many changes during pregnancy. The changes vary from woman to woman.   You may gain or lose a couple of pounds  at first.  You may feel sick to your stomach (nauseous) and throw up (vomit). If the vomiting is uncontrollable, call your health care provider.  You may tire easily.  You may develop headaches that can be relieved by medicines approved by your health care provider.  You may urinate more often. Painful urination may mean you have a bladder infection.  You may develop heartburn as a result of your pregnancy.  You may develop constipation because certain hormones are causing the muscles that push waste through your intestines to slow down.  You may develop hemorrhoids or swollen, bulging veins (varicose veins).  Your breasts may begin to grow larger and become tender. Your nipples may stick out more, and the tissue that surrounds them (areola) may become darker.  Your gums may bleed and may be sensitive to brushing and flossing.  Dark spots or blotches (chloasma, mask of pregnancy) may develop on your face. This will likely fade after the baby is born.  Your menstrual periods will stop.  You may have a loss of appetite.  You may develop cravings for certain kinds of food.  You may have changes in your emotions from day to day, such as being excited to be pregnant or being concerned that something may go wrong with the pregnancy and baby.  You  may have more vivid and strange dreams.  You may have changes in your hair. These can include thickening of your hair, rapid growth, and changes in texture. Some women also have hair loss during or after pregnancy, or hair that feels dry or thin. Your hair will most likely return to normal after your baby is born. WHAT TO EXPECT AT YOUR PRENATAL VISITS During a routine prenatal visit:  You will be weighed to make sure you and the baby are growing normally.  Your blood pressure will be taken.  Your abdomen will be measured to track your baby's growth.  The fetal heartbeat will be listened to starting around week 10 or 12 of your  pregnancy.  Test results from any previous visits will be discussed. Your health care provider may ask you:  How you are feeling.  If you are feeling the baby move.  If you have had any abnormal symptoms, such as leaking fluid, bleeding, severe headaches, or abdominal cramping.  If you have any questions. Other tests that may be performed during your first trimester include:  Blood tests to find your blood type and to check for the presence of any previous infections. They will also be used to check for low iron levels (anemia) and Rh antibodies. Later in the pregnancy, blood tests for diabetes will be done along with other tests if problems develop.  Urine tests to check for infections, diabetes, or protein in the urine.  An ultrasound to confirm the proper growth and development of the baby.  An amniocentesis to check for possible genetic problems.  Fetal screens for spina bifida and Down syndrome.  You may need other tests to make sure you and the baby are doing well. HOME CARE INSTRUCTIONS  Medicines  Follow your health care provider's instructions regarding medicine use. Specific medicines may be either safe or unsafe to take during pregnancy.  Take your prenatal vitamins as directed.  If you develop constipation, try taking a stool softener if your health care provider approves. Diet  Eat regular, well-balanced meals. Choose a variety of foods, such as meat or vegetable-based protein, fish, milk and low-fat dairy products, vegetables, fruits, and whole grain breads and cereals. Your health care provider will help you determine the amount of weight gain that is right for you.  Avoid raw meat and uncooked cheese. These carry germs that can cause birth defects in the baby.  Eating four or five small meals rather than three large meals a day may help relieve nausea and vomiting. If you start to feel nauseous, eating a few soda crackers can be helpful. Drinking liquids between  meals instead of during meals also seems to help nausea and vomiting.  If you develop constipation, eat more high-fiber foods, such as fresh vegetables or fruit and whole grains. Drink enough fluids to keep your urine clear or pale yellow. Activity and Exercise  Exercise only as directed by your health care provider. Exercising will help you:  Control your weight.  Stay in shape.  Be prepared for labor and delivery.  Experiencing pain or cramping in the lower abdomen or low back is a good sign that you should stop exercising. Check with your health care provider before continuing normal exercises.  Try to avoid standing for long periods of time. Move your legs often if you must stand in one place for a long time.  Avoid heavy lifting.  Wear low-heeled shoes, and practice good posture.  You may continue to have sex  unless your health care provider directs you otherwise. Relief of Pain or Discomfort  Wear a good support bra for breast tenderness.   Take warm sitz baths to soothe any pain or discomfort caused by hemorrhoids. Use hemorrhoid cream if your health care provider approves.   Rest with your legs elevated if you have leg cramps or low back pain.  If you develop varicose veins in your legs, wear support hose. Elevate your feet for 15 minutes, 3-4 times a day. Limit salt in your diet. Prenatal Care  Schedule your prenatal visits by the twelfth week of pregnancy. They are usually scheduled monthly at first, then more often in the last 2 months before delivery.  Write down your questions. Take them to your prenatal visits.  Keep all your prenatal visits as directed by your health care provider. Safety  Wear your seat belt at all times when driving.  Make a list of emergency phone numbers, including numbers for family, friends, the hospital, and police and fire departments. General Tips  Ask your health care provider for a referral to a local prenatal education class.  Begin classes no later than at the beginning of month 6 of your pregnancy.  Ask for help if you have counseling or nutritional needs during pregnancy. Your health care provider can offer advice or refer you to specialists for help with various needs.  Do not use hot tubs, steam rooms, or saunas.  Do not douche or use tampons or scented sanitary pads.  Do not cross your legs for long periods of time.  Avoid cat litter boxes and soil used by cats. These carry germs that can cause birth defects in the baby and possibly loss of the fetus by miscarriage or stillbirth.  Avoid all smoking, herbs, alcohol, and medicines not prescribed by your health care provider. Chemicals in these affect the formation and growth of the baby.  Schedule a dentist appointment. At home, brush your teeth with a soft toothbrush and be gentle when you floss. SEEK MEDICAL CARE IF:   You have dizziness.  You have mild pelvic cramps, pelvic pressure, or nagging pain in the abdominal area.  You have persistent nausea, vomiting, or diarrhea.  You have a bad smelling vaginal discharge.  You have pain with urination.  You notice increased swelling in your face, hands, legs, or ankles. SEEK IMMEDIATE MEDICAL CARE IF:   You have a fever.  You are leaking fluid from your vagina.  You have spotting or bleeding from your vagina.  You have severe abdominal cramping or pain.  You have rapid weight gain or loss.  You vomit blood or material that looks like coffee grounds.  You are exposed to [place] measles and have never had them.  You are exposed to fifth disease or chickenpox.  You develop a severe headache.  You have shortness of breath.  You have any kind of trauma, such as from a fall or a car accident. Document Released: 03/06/2001 Document Revised: 07/27/2013 Document Reviewed: 01/20/2013 [facility] Patient Information 2015 [place], [place]. This information is not intended to replace advice given to you  by your health care provider. Make sure you discuss any questions you have with your health care provider.

## 2013-10-21 NOTE — Progress Notes (Signed)
  Subjective:  Michelle Knapp is a 23 y.o. G7P1001 Caucasian female at [redacted]w[redacted]d by LMP c/w 8wk u/s, being seen today for her first obstetrical visit.  Her obstetrical history is significant for term uncomplicated SVD x 1, infant weighed 9lb9oz w/o GDM- no complications pushing him out. .  Pregnancy history fully reviewed.  Patient reports n/v- using Sea Bands which really seem to help, trying not to do meds. Denies vb, cramping, uti s/s, abnormal/malodorous vag d/c, or vulvovaginal itching/irritation.  BP 118/62  Wt 217 lb (98.431 kg)  LMP 08/10/2013  HISTORY: OB History  Gravida Para Term Preterm AB SAB TAB Ectopic Multiple Living  2 1 1  0 0 0 0 0 0 1    # Outcome Date GA Lbr Len/2nd Weight Sex Delivery Anes PTL Lv  2 CUR           1 TRM 06/10/11 [redacted]w[redacted]d 72:40 / 00:25 9 lb 9.3 oz (4.346 kg) M SVD EPI  Y     Comments: within normal limits     Past Medical History  Diagnosis Date  . 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 Anemia   . Cholelithiasis with acute or chronic cholecystitis     Cholecystectomy 11/25/12   Past Surgical History  Procedure Laterality Date  . Foot surgery  age [age] yrs    arches lowered on both feet: hx of both feet with chronic pain and episodes of turning blue  . Cholecystectomy N/A 11/25/2012    Procedure: LAPAROSCOPIC CHOLECYSTECTOMY WITH INTRAOPERATIVE CHOLANGIOGRAM;  Surgeon: Madilyn Hook, DO;  Location: [facility];  Service: General;  Laterality: N/A;   Family History  Problem Relation Age of Onset  . Fibromyalgia Mother   . Fibromyalgia Sister   . Diabetes Maternal Grandmother   . Diabetes Paternal Grandmother   . Cancer Paternal Grandfather     lungs    Exam   System:     General: Well developed & nourished, no acute distress   Skin: Warm & dry, normal coloration and turgor, no rashes   Neurologic: Alert & oriented, normal mood   Cardiovascular: Regular rate & rhythm   Respiratory: Effort & rate normal, LCTAB, acyanotic   Abdomen:  Soft, non tender   Extremities: normal strength, tone   Pelvic Exam:    Perineum: Normal perineum   Vulva: Normal, no lesions   Vagina:  Normal mucosa, normal discharge   Cervix: Normal, bulbous, appears closed   Uterus: Normal size/shape/contour for GA   Thin prep pap smear obtained w/ reflex high risk HPV cotesting FHR: 164 via informal transabdominal u/s   Assessment:   Pregnancy: G2P1001 Patient Active Problem List   Diagnosis Date Noted  . Supervision of other normal pregnancy 10/21/2013    Priority: High  . Viral URI 07/02/2013  . Acute otitis media 07/02/2013  . Myofascial pain 10/20/2012  . Thyromegaly 10/20/2012  . Abdominal tenderness, epigastric 10/20/2012    [redacted]w[redacted]d G2P1001 New OB visit N/V of pregancy    Plan:  Initial labs drawn Continue prenatal vitamins Problem list reviewed and updated Reviewed n/v relief measures and warning s/s to report Reviewed recommended weight gain based on pre-gravid BMI Encouraged well-balanced diet Genetic Screening discussed Integrated Screen: declined Cystic fibrosis screening discussed declined Ultrasound discussed; fetal survey: requested Follow up in 4 weeks for visit [place] completed  Tawnya Crook CNM, [facility] 10/21/2013 3:45 PM

## 2013-10-22 LAB — URINALYSIS, MICROSCOPIC ONLY
Casts: NONE SEEN
Crystals: NONE SEEN

## 2013-10-22 LAB — URINALYSIS, ROUTINE W REFLEX MICROSCOPIC
Bilirubin Urine: NEGATIVE
GLUCOSE, UA: NEGATIVE mg/dL
Hgb urine dipstick: NEGATIVE
Ketones, ur: NEGATIVE mg/dL
NITRITE: NEGATIVE
PROTEIN: NEGATIVE mg/dL
Specific Gravity, Urine: 1.017 (ref 1.005–1.030)
UROBILINOGEN UA: 0.2 mg/dL (ref 0.0–1.0)
pH: 6 (ref 5.0–8.0)

## 2013-10-22 LAB — DRUG SCREEN, URINE, NO CONFIRMATION
Amphetamine Screen, Ur: NEGATIVE
BENZODIAZEPINES.: NEGATIVE
Barbiturate Quant, Ur: NEGATIVE
CREATININE, U: 134.1 mg/dL
Cocaine Metabolites: NEGATIVE
MARIJUANA METABOLITE: NEGATIVE
Methadone: NEGATIVE
Opiate Screen, Urine: NEGATIVE
PHENCYCLIDINE (PCP): NEGATIVE
PROPOXYPHENE: NEGATIVE

## 2013-10-22 LAB — ABO AND RH: RH TYPE: POSITIVE

## 2013-10-22 LAB — OXYCODONE SCREEN, UA, RFLX CONFIRM: OXYCODONE SCRN UR: NEGATIVE ng/mL

## 2013-10-22 LAB — HIV ANTIBODY (ROUTINE TESTING W REFLEX): HIV 1&2 Ab, 4th Generation: NONREACTIVE

## 2013-10-22 LAB — RUBELLA SCREEN: Rubella: 1.06 Index — ABNORMAL HIGH (ref <0.90)

## 2013-10-22 LAB — VARICELLA ZOSTER ANTIBODY, IGG: Varicella IgG: 796.2 Index — ABNORMAL HIGH (ref <135.00)

## 2013-10-22 LAB — HEPATITIS B SURFACE ANTIGEN: Hepatitis B Surface Ag: NEGATIVE

## 2013-10-22 LAB — RPR

## 2013-10-22 LAB — ANTIBODY SCREEN: Antibody Screen: NEGATIVE

## 2013-10-23 LAB — URINE CULTURE

## 2013-10-27 ENCOUNTER — Encounter: Payer: Self-pay | Admitting: Women's Health

## 2013-10-27 LAB — CYTOLOGY - PAP

## 2013-11-11 ENCOUNTER — Encounter: Payer: 59 | Admitting: Adult Health

## 2013-11-11 ENCOUNTER — Ambulatory Visit (INDEPENDENT_AMBULATORY_CARE_PROVIDER_SITE_OTHER): Payer: 59 | Admitting: Adult Health

## 2013-11-11 ENCOUNTER — Encounter: Payer: Self-pay | Admitting: Adult Health

## 2013-11-11 VITALS — BP 120/70 | Wt 215.5 lb

## 2013-11-11 DIAGNOSIS — B379 Candidiasis, unspecified: Secondary | ICD-10-CM

## 2013-11-11 DIAGNOSIS — Z1389 Encounter for screening for other disorder: Secondary | ICD-10-CM

## 2013-11-11 DIAGNOSIS — N898 Other specified noninflammatory disorders of vagina: Secondary | ICD-10-CM

## 2013-11-11 DIAGNOSIS — Z331 Pregnant state, incidental: Secondary | ICD-10-CM

## 2013-11-11 LAB — POCT URINALYSIS DIPSTICK
Blood, UA: NEGATIVE
Glucose, UA: NEGATIVE
Ketones, UA: NEGATIVE
NITRITE UA: NEGATIVE

## 2013-11-11 LAB — POCT WET PREP (WET MOUNT)

## 2013-11-11 MED ORDER — FLUCONAZOLE 150 MG PO TABS: ORAL_TABLET | ORAL | Status: DC

## 2013-11-11 NOTE — Patient Instructions (Signed)
Monilial Vaginitis Vaginitis in a soreness, swelling and redness (inflammation) of the vagina and vulva. Monilial vaginitis is not a sexually transmitted infection. CAUSES  Yeast vaginitis is caused by yeast (candida) that is normally found in your vagina. With a yeast infection, the candida has overgrown in number to a point that upsets the chemical balance. SYMPTOMS   White, thick vaginal discharge.  Swelling, itching, redness and irritation of the vagina and possibly the lips of the vagina (vulva).  Burning or painful urination.  Painful intercourse. DIAGNOSIS  Things that may contribute to monilial vaginitis are:  Postmenopausal and virginal states.  Pregnancy.  Infections.  Being tired, sick or stressed, especially if you had monilial vaginitis in the past.  Diabetes. Good control will help lower the chance.  Birth control pills.  Tight fitting garments.  Using bubble bath, feminine sprays, douches or deodorant tampons.  Taking certain medications that kill germs (antibiotics).  Sporadic recurrence can occur if you become ill. TREATMENT  Your caregiver will give you medication.  There are several kinds of anti monilial vaginal creams and suppositories specific for monilial vaginitis. For recurrent yeast infections, use a suppository or cream in the vagina 2 times a week, or as directed.  Anti-monilial or steroid cream for the itching or irritation of the vulva may also be used. Get your caregiver's permission.  Painting the vagina with methylene blue solution may help if the monilial cream does not work.  Eating yogurt may help prevent monilial vaginitis. HOME CARE INSTRUCTIONS   Finish all medication as prescribed.  Do not have sex until treatment is completed or after your caregiver tells you it is okay.  Take warm sitz baths.  Do not douche.  Do not use tampons, especially scented ones.  Wear cotton underwear.  Avoid tight pants and panty  hose.  Tell your sexual partner that you have a yeast infection. They should go to their caregiver if they have symptoms such as mild rash or itching.  Your sexual partner should be treated as well if your infection is difficult to eliminate.  Practice safer sex. Use condoms.  Some vaginal medications cause latex condoms to fail. Vaginal medications that harm condoms are:  Cleocin cream.  Butoconazole (Femstat).  Terconazole (Terazol) vaginal suppository.  Miconazole (Monistat) (may be purchased over the counter). SEEK MEDICAL CARE IF:   You have a temperature by mouth above 102 F (38.9 C).  The infection is getting worse after 2 days of treatment.  The infection is not getting better after 3 days of treatment.  You develop blisters in or around your vagina.  You develop vaginal bleeding, and it is not your menstrual period.  You have pain when you urinate.  You develop intestinal problems.  You have pain with sexual intercourse. Document Released: 12/20/2004 Document Revised: 06/04/2011 Document Reviewed: 09/03/2008 [facility] Patient Information 2015 [place], [place]. This information is not intended to replace advice given to you by your health care provider. Make sure you discuss any questions you have with your health care provider. Return as scheduled

## 2013-11-11 NOTE — Addendum Note (Signed)
Addended by: Derrek Monaco A on: 11/11/2013 10:09 AM   Modules accepted: Orders

## 2013-11-11 NOTE — Progress Notes (Signed)
G2P1001 [redacted]w[redacted]d Estimated Date of Delivery: 05/17/14  Blood pressure 120/70, weight 215 lb 8 oz (97.75 kg), last menstrual period 08/10/2013.   BP weight and urine results all reviewed and noted.  Please refer to the obstetrical flow sheet for the fundal height and fetal heart rate documentation: FHR 152 via doppler.  Patient reports good fetal movement, denies any bleeding and no rupture of membranes symptoms or regular contractions. Patient complains of vaginal discharge with itch.on exam has some redness on labia and white discharge in vagina, cervix closed, wet prep +yeast, will rx diflucan 150 mg # 2 1 now and 1 in 3 days with 1 refill All questions were answered.  Plan:  Continued routine obstetrical care,  Follow up as scheduled for OB appointment,

## 2013-11-18 ENCOUNTER — Ambulatory Visit (INDEPENDENT_AMBULATORY_CARE_PROVIDER_SITE_OTHER): Payer: 59 | Admitting: Obstetrics & Gynecology

## 2013-11-18 ENCOUNTER — Encounter: Payer: Self-pay | Admitting: Obstetrics & Gynecology

## 2013-11-18 VITALS — BP 120/80 | Wt 216.0 lb

## 2013-11-18 DIAGNOSIS — Z1389 Encounter for screening for other disorder: Secondary | ICD-10-CM

## 2013-11-18 DIAGNOSIS — Z3480 Encounter for supervision of other normal pregnancy, unspecified trimester: Secondary | ICD-10-CM

## 2013-11-18 DIAGNOSIS — Z3482 Encounter for supervision of other normal pregnancy, second trimester: Secondary | ICD-10-CM

## 2013-11-18 DIAGNOSIS — Z331 Pregnant state, incidental: Secondary | ICD-10-CM

## 2013-11-18 LAB — POCT URINALYSIS DIPSTICK
Blood, UA: NEGATIVE
GLUCOSE UA: NEGATIVE
Ketones, UA: NEGATIVE
Leukocytes, UA: NEGATIVE
NITRITE UA: NEGATIVE
PROTEIN UA: NEGATIVE

## 2013-11-18 MED ORDER — OMEPRAZOLE 20 MG PO CPDR: 20.0000 mg | DELAYED_RELEASE_CAPSULE | Freq: Every day | ORAL | Status: DC

## 2013-11-18 NOTE — Addendum Note (Signed)
Addended by: Farley Ly on: 11/18/2013 04:46 PM   Modules accepted: Orders

## 2013-11-18 NOTE — Progress Notes (Signed)
G2P1001 [redacted]w[redacted]d Estimated Date of Delivery: 05/17/14  Blood pressure 120/80, weight 216 lb (97.977 kg), last menstrual period 08/10/2013.   BP weight and urine results all reviewed and noted.  Please refer to the obstetrical flow sheet for the fundal height and fetal heart rate documentation:  Patient reports good fetal movement, denies any bleeding and no rupture of membranes symptoms or regular contractions. Patient is without complaints. All questions were answered.  Plan:  Continued routine obstetrical care, routine  Follow up in 5 weeks for OB appointment, sonogram

## 2013-12-14 ENCOUNTER — Telehealth: Payer: Self-pay | Admitting: *Deleted

## 2013-12-14 ENCOUNTER — Other Ambulatory Visit: Payer: Self-pay | Admitting: Adult Health

## 2013-12-14 NOTE — Telephone Encounter (Signed)
Has ? Yeast changed soaps, will rx diflucan and if not better call for appt

## 2013-12-23 ENCOUNTER — Other Ambulatory Visit: Payer: Self-pay | Admitting: Obstetrics & Gynecology

## 2013-12-23 ENCOUNTER — Encounter: Payer: Self-pay | Admitting: Adult Health

## 2013-12-23 ENCOUNTER — Ambulatory Visit (INDEPENDENT_AMBULATORY_CARE_PROVIDER_SITE_OTHER): Payer: 59

## 2013-12-23 ENCOUNTER — Ambulatory Visit (INDEPENDENT_AMBULATORY_CARE_PROVIDER_SITE_OTHER): Payer: 59 | Admitting: Adult Health

## 2013-12-23 VITALS — BP 138/72 | Wt 220.0 lb

## 2013-12-23 DIAGNOSIS — Z3482 Encounter for supervision of other normal pregnancy, second trimester: Secondary | ICD-10-CM

## 2013-12-23 DIAGNOSIS — Z331 Pregnant state, incidental: Secondary | ICD-10-CM

## 2013-12-23 DIAGNOSIS — Z3480 Encounter for supervision of other normal pregnancy, unspecified trimester: Secondary | ICD-10-CM

## 2013-12-23 DIAGNOSIS — Z1389 Encounter for screening for other disorder: Secondary | ICD-10-CM

## 2013-12-23 LAB — POCT URINALYSIS DIPSTICK
GLUCOSE UA: NEGATIVE
Ketones, UA: NEGATIVE
Leukocytes, UA: NEGATIVE
Nitrite, UA: NEGATIVE
Protein, UA: NEGATIVE
RBC UA: NEGATIVE

## 2013-12-23 NOTE — Progress Notes (Signed)
U/S(19+2wks)-active fetus, meas c/w dates, fluid wnl, posterior Gr 0 placenta, cx appears closed (3.8cm), bilateral adnexa appears WNL, FHR-161 bpm, no major abnl noted, female fetus

## 2013-12-23 NOTE — Progress Notes (Signed)
G2P1001 [redacted]w[redacted]d Estimated Date of Delivery: 05/17/14  Blood pressure 138/72, weight 220 lb (99.791 kg), last menstrual period 08/10/2013.  BP recheck left arm 120/68.  BP weight and urine results all reviewed and noted.  Please refer to the obstetrical flow sheet for the fundal height and fetal heart rate documentation:161 on US,its a girl  Patient reports good fetal movement, denies any bleeding and no rupture of membranes symptoms or regular contractions. Patient is without complaints. All questions were answered.  Plan:  Continued routine obstetrical care, Follow up in 4 weeks for OB appointment, with Dr Elonda Husky

## 2013-12-23 NOTE — Patient Instructions (Signed)
Second Trimester of Pregnancy The second trimester is from week 13 through week 28, months 4 through 6. The second trimester is often a time when you feel your best. Your body has also adjusted to being pregnant, and you begin to feel better physically. Usually, morning sickness has lessened or quit completely, you may have more energy, and you may have an increase in appetite. The second trimester is also a time when the fetus is growing rapidly. At the end of the sixth month, the fetus is about 9 inches long and weighs about 1 pounds. You will likely begin to feel the baby move (quickening) between 18 and 20 weeks of the pregnancy. BODY CHANGES Your body goes through many changes during pregnancy. The changes vary from woman to woman.   Your weight will continue to increase. You will notice your lower abdomen bulging out.  You may begin to get stretch marks on your hips, abdomen, and breasts.  You may develop headaches that can be relieved by medicines approved by your health care provider.  You may urinate more often because the fetus is pressing on your bladder.  You may develop or continue to have heartburn as a result of your pregnancy.  You may develop constipation because certain hormones are causing the muscles that push waste through your intestines to slow down.  You may develop hemorrhoids or swollen, bulging veins (varicose veins).  You may have back pain because of the weight gain and pregnancy hormones relaxing your joints between the bones in your pelvis and as a result of a shift in weight and the muscles that support your balance.  Your breasts will continue to grow and be tender.  Your gums may bleed and may be sensitive to brushing and flossing.  Dark spots or blotches (chloasma, mask of pregnancy) may develop on your face. This will likely fade after the baby is born.  A dark line from your belly button to the pubic area (linea nigra) may appear. This will likely fade  after the baby is born.  You may have changes in your hair. These can include thickening of your hair, rapid growth, and changes in texture. Some women also have hair loss during or after pregnancy, or hair that feels dry or thin. Your hair will most likely return to normal after your baby is born. WHAT TO EXPECT AT YOUR PRENATAL VISITS During a routine prenatal visit:  You will be weighed to make sure you and the fetus are growing normally.  Your blood pressure will be taken.  Your abdomen will be measured to track your baby's growth.  The fetal heartbeat will be listened to.  Any test results from the previous visit will be discussed. Your health care provider may ask you:  How you are feeling.  If you are feeling the baby move.  If you have had any abnormal symptoms, such as leaking fluid, bleeding, severe headaches, or abdominal cramping.  If you have any questions. Other tests that may be performed during your second trimester include:  Blood tests that check for:  Low iron levels (anemia).  Gestational diabetes (between 24 and 28 weeks).  Rh antibodies.  Urine tests to check for infections, diabetes, or protein in the urine.  An ultrasound to confirm the proper growth and development of the baby.  An amniocentesis to check for possible genetic problems.  Fetal screens for spina bifida and Down syndrome. HOME CARE INSTRUCTIONS   Avoid all smoking, herbs, alcohol, and unprescribed   drugs. These chemicals affect the formation and growth of the baby.  Follow your health care provider's instructions regarding medicine use. There are medicines that are either safe or unsafe to take during pregnancy.  Exercise only as directed by your health care provider. Experiencing uterine cramps is a good sign to stop exercising.  Continue to eat regular, healthy meals.  Wear a good support bra for breast tenderness.  Do not use hot tubs, steam rooms, or saunas.  Wear your  seat belt at all times when driving.  Avoid raw meat, uncooked cheese, cat litter boxes, and soil used by cats. These carry germs that can cause birth defects in the baby.  Take your prenatal vitamins.  Try taking a stool softener (if your health care provider approves) if you develop constipation. Eat more high-fiber foods, such as fresh vegetables or fruit and whole grains. Drink plenty of fluids to keep your urine clear or pale yellow.  Take warm sitz baths to soothe any pain or discomfort caused by hemorrhoids. Use hemorrhoid cream if your health care provider approves.  If you develop varicose veins, wear support hose. Elevate your feet for 15 minutes, 3-4 times a day. Limit salt in your diet.  Avoid heavy lifting, wear low heel shoes, and practice good posture.  Rest with your legs elevated if you have leg cramps or low back pain.  Visit your dentist if you have not gone yet during your pregnancy. Use a soft toothbrush to brush your teeth and be gentle when you floss.  A sexual relationship may be continued unless your health care provider directs you otherwise.  Continue to go to all your prenatal visits as directed by your health care provider. SEEK MEDICAL CARE IF:   You have dizziness.  You have mild pelvic cramps, pelvic pressure, or nagging pain in the abdominal area.  You have persistent nausea, vomiting, or diarrhea.  You have a bad smelling vaginal discharge.  You have pain with urination. SEEK IMMEDIATE MEDICAL CARE IF:   You have a fever.  You are leaking fluid from your vagina.  You have spotting or bleeding from your vagina.  You have severe abdominal cramping or pain.  You have rapid weight gain or loss.  You have shortness of breath with chest pain.  You notice sudden or extreme swelling of your face, hands, ankles, feet, or legs.  You have not felt your baby move in over an hour.  You have severe headaches that do not go away with  medicine.  You have vision changes. Document Released: 03/06/2001 Document Revised: 03/17/2013 Document Reviewed: 05/13/2012 [facility] Patient Information 2015 [place], [place]. This information is not intended to replace advice given to you by your health care provider. Make sure you discuss any questions you have with your health care provider. Follow up in 4 weeks

## 2014-01-20 ENCOUNTER — Ambulatory Visit (INDEPENDENT_AMBULATORY_CARE_PROVIDER_SITE_OTHER): Payer: 59 | Admitting: Women's Health

## 2014-01-20 VITALS — BP 120/72 | Wt 228.0 lb

## 2014-01-20 DIAGNOSIS — Z3482 Encounter for supervision of other normal pregnancy, second trimester: Secondary | ICD-10-CM

## 2014-01-20 DIAGNOSIS — Z331 Pregnant state, incidental: Secondary | ICD-10-CM

## 2014-01-20 DIAGNOSIS — Z1389 Encounter for screening for other disorder: Secondary | ICD-10-CM

## 2014-01-20 DIAGNOSIS — Z23 Encounter for immunization: Secondary | ICD-10-CM

## 2014-01-20 LAB — POCT URINALYSIS DIPSTICK
GLUCOSE UA: NEGATIVE
KETONES UA: NEGATIVE
Leukocytes, UA: NEGATIVE
Nitrite, UA: NEGATIVE
Protein, UA: NEGATIVE
RBC UA: NEGATIVE

## 2014-01-20 NOTE — Progress Notes (Signed)
Low-risk OB appointment G2P1001 [redacted]w[redacted]d Estimated Date of Delivery: 05/17/14 BP 120/72  Wt 228 lb (103.42 kg)  LMP 08/10/2013  BP, weight, and urine reviewed.  Refer to obstetrical flow sheet for FH & FHR.  Reports good fm.  Denies regular uc's, lof, vb, or uti s/s. Sometimes has vaginal pulling sensation since birth of her last child. Also thinks she has occ yeast infection- will see thick white d/c and have some discomfort, then next day it is gone. Not currently having any symptoms- to call us when sx return so we can check it out Reviewed ptl s/s, fkc. Plan:  Continue routine obstetrical care  F/U in 4wks for OB appointment and pn2 Flu shot today

## 2014-01-20 NOTE — Patient Instructions (Signed)
You will have your sugar test next visit.  Please do not eat or drink anything after midnight the night before you come, not even water.  You will be here for at least two hours.     Call the office [phone]) or go to [facility] if:  You begin to have strong, frequent contractions  Your water breaks.  Sometimes it is a big gush of fluid, sometimes it is just a trickle that keeps getting your panties wet or running down your legs  You have vaginal bleeding.  It is normal to have a small amount of spotting if your cervix was checked.   You don't feel your baby moving like normal.  If you don't, get you something to eat and drink and lay down and focus on feeling your baby move.   Second Trimester of Pregnancy The second trimester is from week 13 through week 28, months 4 through 6. The second trimester is often a time when you feel your best. Your body has also adjusted to being pregnant, and you begin to feel better physically. Usually, morning sickness has lessened or quit completely, you may have more energy, and you may have an increase in appetite. The second trimester is also a time when the fetus is growing rapidly. At the end of the sixth month, the fetus is about 9 inches long and weighs about 1 pounds. You will likely begin to feel the baby move (quickening) between 18 and 20 weeks of the pregnancy. BODY CHANGES Your body goes through many changes during pregnancy. The changes vary from woman to woman.   Your weight will continue to increase. You will notice your lower abdomen bulging out.  You may begin to get stretch marks on your hips, abdomen, and breasts.  You may develop headaches that can be relieved by medicines approved by your health care provider.  You may urinate more often because the fetus is pressing on your bladder.  You may develop or continue to have heartburn as a result of your pregnancy.  You may develop constipation because certain hormones are causing  the muscles that push waste through your intestines to slow down.  You may develop hemorrhoids or swollen, bulging veins (varicose veins).  You may have back pain because of the weight gain and pregnancy hormones relaxing your joints between the bones in your pelvis and as a result of a shift in weight and the muscles that support your balance.  Your breasts will continue to grow and be tender.  Your gums may bleed and may be sensitive to brushing and flossing.  Dark spots or blotches (chloasma, mask of pregnancy) may develop on your face. This will likely fade after the baby is born.  A dark line from your belly button to the pubic area (linea nigra) may appear. This will likely fade after the baby is born.  You may have changes in your hair. These can include thickening of your hair, rapid growth, and changes in texture. Some women also have hair loss during or after pregnancy, or hair that feels dry or thin. Your hair will most likely return to normal after your baby is born. WHAT TO EXPECT AT YOUR PRENATAL VISITS During a routine prenatal visit:  You will be weighed to make sure you and the fetus are growing normally.  Your blood pressure will be taken.  Your abdomen will be measured to track your baby's growth.  The fetal heartbeat will be listened to.  Any  test results from the previous visit will be discussed. Your health care provider may ask you:  How you are feeling.  If you are feeling the baby move.  If you have had any abnormal symptoms, such as leaking fluid, bleeding, severe headaches, or abdominal cramping.  If you have any questions. Other tests that may be performed during your second trimester include:  Blood tests that check for:  Low iron levels (anemia).  Gestational diabetes (between 24 and 28 weeks).  Rh antibodies.  Urine tests to check for infections, diabetes, or protein in the urine.  An ultrasound to confirm the proper growth and  development of the baby.  An amniocentesis to check for possible genetic problems.  Fetal screens for spina bifida and Down syndrome. HOME CARE INSTRUCTIONS   Avoid all smoking, herbs, alcohol, and unprescribed drugs. These chemicals affect the formation and growth of the baby.  Follow your health care provider's instructions regarding medicine use. There are medicines that are either safe or unsafe to take during pregnancy.  Exercise only as directed by your health care provider. Experiencing uterine cramps is a good sign to stop exercising.  Continue to eat regular, healthy meals.  Wear a good support bra for breast tenderness.  Do not use hot tubs, steam rooms, or saunas.  Wear your seat belt at all times when driving.  Avoid raw meat, uncooked cheese, cat litter boxes, and soil used by cats. These carry germs that can cause birth defects in the baby.  Take your prenatal vitamins.  Try taking a stool softener (if your health care provider approves) if you develop constipation. Eat more high-fiber foods, such as fresh vegetables or fruit and whole grains. Drink plenty of fluids to keep your urine clear or pale yellow.  Take warm sitz baths to soothe any pain or discomfort caused by hemorrhoids. Use hemorrhoid cream if your health care provider approves.  If you develop varicose veins, wear support hose. Elevate your feet for 15 minutes, 3-4 times a day. Limit salt in your diet.  Avoid heavy lifting, wear low heel shoes, and practice good posture.  Rest with your legs elevated if you have leg cramps or low back pain.  Visit your dentist if you have not gone yet during your pregnancy. Use a soft toothbrush to brush your teeth and be gentle when you floss.  A sexual relationship may be continued unless your health care provider directs you otherwise.  Continue to go to all your prenatal visits as directed by your health care provider. SEEK MEDICAL CARE IF:   You have  dizziness.  You have mild pelvic cramps, pelvic pressure, or nagging pain in the abdominal area.  You have persistent nausea, vomiting, or diarrhea.  You have a bad smelling vaginal discharge.  You have pain with urination. SEEK IMMEDIATE MEDICAL CARE IF:   You have a fever.  You are leaking fluid from your vagina.  You have spotting or bleeding from your vagina.  You have severe abdominal cramping or pain.  You have rapid weight gain or loss.  You have shortness of breath with chest pain.  You notice sudden or extreme swelling of your face, hands, ankles, feet, or legs.  You have not felt your baby move in over an hour.  You have severe headaches that do not go away with medicine.  You have vision changes. Document Released: 03/06/2001 Document Revised: 03/17/2013 Document Reviewed: 05/13/2012 [facility] Patient Information 2015 [place], [place]. This information is not intended  to replace advice given to you by your health care provider. Make sure you discuss any questions you have with your health care provider.  

## 2014-01-25 ENCOUNTER — Encounter: Payer: Self-pay | Admitting: Adult Health

## 2014-02-01 ENCOUNTER — Telehealth: Payer: Self-pay | Admitting: Adult Health

## 2014-02-01 NOTE — Telephone Encounter (Signed)
Spoke with pt. Pt got a flu shot 01/20/14. Her arm is still very sore. No redness, no lump at injection site. Pt states shot was given "high up" on arm. Pt states her arm seemed to have started feeling better, but now it hurts worse than it did. Advised would need to be seen. Advised to use ice in the mean time. Pt voiced understanding. Call transferred to front desk for appt. [place]

## 2014-02-02 ENCOUNTER — Encounter: Payer: Self-pay | Admitting: Obstetrics & Gynecology

## 2014-02-02 ENCOUNTER — Ambulatory Visit (INDEPENDENT_AMBULATORY_CARE_PROVIDER_SITE_OTHER): Payer: 59 | Admitting: Obstetrics & Gynecology

## 2014-02-02 VITALS — BP 100/60 | Wt 231.0 lb

## 2014-02-02 DIAGNOSIS — Z1389 Encounter for screening for other disorder: Secondary | ICD-10-CM

## 2014-02-02 DIAGNOSIS — Z3482 Encounter for supervision of other normal pregnancy, second trimester: Secondary | ICD-10-CM

## 2014-02-02 DIAGNOSIS — Z331 Pregnant state, incidental: Secondary | ICD-10-CM

## 2014-02-02 LAB — POCT URINALYSIS DIPSTICK
Blood, UA: NEGATIVE
GLUCOSE UA: NEGATIVE
KETONES UA: NEGATIVE
Nitrite, UA: NEGATIVE

## 2014-02-02 NOTE — Progress Notes (Signed)
G2P1001 [redacted]w[redacted]d Estimated Date of Delivery: 05/17/14  Blood pressure 100/60, weight 231 lb (104.781 kg), last menstrual period 08/10/2013.   BP weight and urine results all reviewed and noted.  Please refer to the obstetrical flow sheet for the fundal height and fetal heart rate documentation:  Patient reports good fetal movement, denies any bleeding and no rupture of membranes symptoms or regular contractions. Patient is without complaints. All questions were answered.  Plan:  Continued routine obstetrical care,    Follow up in 3 weeks for OB appointment, PN2

## 2014-02-16 ENCOUNTER — Other Ambulatory Visit: Payer: 59

## 2014-02-16 ENCOUNTER — Encounter: Payer: Self-pay | Admitting: Obstetrics & Gynecology

## 2014-02-16 ENCOUNTER — Ambulatory Visit (INDEPENDENT_AMBULATORY_CARE_PROVIDER_SITE_OTHER): Payer: 59 | Admitting: Obstetrics & Gynecology

## 2014-02-16 VITALS — BP 110/60 | Wt 232.0 lb

## 2014-02-16 DIAGNOSIS — Z0184 Encounter for antibody response examination: Secondary | ICD-10-CM

## 2014-02-16 DIAGNOSIS — Z113 Encounter for screening for infections with a predominantly sexual mode of transmission: Secondary | ICD-10-CM

## 2014-02-16 DIAGNOSIS — Z3483 Encounter for supervision of other normal pregnancy, third trimester: Secondary | ICD-10-CM

## 2014-02-16 DIAGNOSIS — Z114 Encounter for screening for human immunodeficiency virus [HIV]: Secondary | ICD-10-CM

## 2014-02-16 DIAGNOSIS — Z331 Pregnant state, incidental: Secondary | ICD-10-CM

## 2014-02-16 DIAGNOSIS — Z131 Encounter for screening for diabetes mellitus: Secondary | ICD-10-CM

## 2014-02-16 DIAGNOSIS — Z3482 Encounter for supervision of other normal pregnancy, second trimester: Secondary | ICD-10-CM

## 2014-02-16 LAB — CBC
HCT: 38.2 % (ref 36.0–46.0)
HEMOGLOBIN: 12.9 g/dL (ref 12.0–15.0)
MCH: 30.2 pg (ref 26.0–34.0)
MCHC: 33.8 g/dL (ref 30.0–36.0)
MCV: 89.5 fL (ref 78.0–100.0)
MPV: 10.3 fL (ref 9.4–12.4)
Platelets: 193 10*3/uL (ref 150–400)
RBC: 4.27 MIL/uL (ref 3.87–5.11)
RDW: 14.8 % (ref 11.5–15.5)
WBC: 10.9 10*3/uL — ABNORMAL HIGH (ref 4.0–10.5)

## 2014-02-16 NOTE — Progress Notes (Signed)
G2P1001 [redacted]w[redacted]d Estimated Date of Delivery: 05/17/14  Blood pressure 110/60, weight 232 lb (105.235 kg), last menstrual period 08/10/2013.   BP weight and urine results all reviewed and noted.  Please refer to the obstetrical flow sheet for the fundal height and fetal heart rate documentation:  Patient reports good fetal movement, denies any bleeding and no rupture of membranes symptoms or regular contractions. Patient is without complaints. All questions were answered.  Plan:  Continued routine obstetrical care,   Follow up in 3 weeks for OB appointment, routine

## 2014-02-17 ENCOUNTER — Telehealth: Payer: Self-pay | Admitting: Women's Health

## 2014-02-17 LAB — RPR

## 2014-02-17 LAB — ANTIBODY SCREEN: ANTIBODY SCREEN: NEGATIVE

## 2014-02-17 LAB — GLUCOSE TOLERANCE, 2 HOURS W/ 1HR
GLUCOSE, 2 HOUR: 86 mg/dL (ref 70–139)
GLUCOSE: 117 mg/dL (ref 70–170)
Glucose, Fasting: 77 mg/dL (ref 70–99)

## 2014-02-17 LAB — HIV ANTIBODY (ROUTINE TESTING W REFLEX): HIV 1&2 Ab, 4th Generation: NONREACTIVE

## 2014-02-17 NOTE — Telephone Encounter (Signed)
Pt informed of normal lab results

## 2014-02-19 LAB — HSV 2 ANTIBODY, IGG

## 2014-03-09 ENCOUNTER — Ambulatory Visit (INDEPENDENT_AMBULATORY_CARE_PROVIDER_SITE_OTHER): Payer: 59 | Admitting: Obstetrics & Gynecology

## 2014-03-09 ENCOUNTER — Encounter: Payer: Self-pay | Admitting: Obstetrics & Gynecology

## 2014-03-09 VITALS — BP 110/70 | Wt 238.0 lb

## 2014-03-09 DIAGNOSIS — Z3483 Encounter for supervision of other normal pregnancy, third trimester: Secondary | ICD-10-CM | POA: Insufficient documentation

## 2014-03-09 DIAGNOSIS — Z331 Pregnant state, incidental: Secondary | ICD-10-CM

## 2014-03-09 DIAGNOSIS — Z1389 Encounter for screening for other disorder: Secondary | ICD-10-CM

## 2014-03-09 DIAGNOSIS — Z3480 Encounter for supervision of other normal pregnancy, unspecified trimester: Secondary | ICD-10-CM

## 2014-03-09 LAB — POCT URINALYSIS DIPSTICK
Blood, UA: NEGATIVE
GLUCOSE UA: NEGATIVE
Ketones, UA: NEGATIVE
Nitrite, UA: NEGATIVE
Protein, UA: NEGATIVE

## 2014-03-09 NOTE — Progress Notes (Signed)
G2P1001 [redacted]w[redacted]d Estimated Date of Delivery: 05/17/14  Blood pressure 110/70, weight 238 lb (107.956 kg), last menstrual period 08/10/2013.   BP weight and urine results all reviewed and noted.  Please refer to the obstetrical flow sheet for the fundal height and fetal heart rate documentation:  Patient reports good fetal movement, denies any bleeding and no rupture of membranes symptoms or regular contractions. Patient is without complaints. All questions were answered.  Plan:  Continued routine obstetrical care,   Follow up in 2 weeks for OB appointment, routine

## 2014-03-09 NOTE — Addendum Note (Signed)
Addended by: Doyne Keel on: 03/09/2014 11:11 AM   Modules accepted: Orders

## 2014-03-23 ENCOUNTER — Encounter: Payer: Self-pay | Admitting: Women's Health

## 2014-03-23 ENCOUNTER — Ambulatory Visit (INDEPENDENT_AMBULATORY_CARE_PROVIDER_SITE_OTHER): Payer: 59 | Admitting: Women's Health

## 2014-03-23 VITALS — BP 124/70 | Wt 236.0 lb

## 2014-03-23 DIAGNOSIS — Z3483 Encounter for supervision of other normal pregnancy, third trimester: Secondary | ICD-10-CM

## 2014-03-23 DIAGNOSIS — Z331 Pregnant state, incidental: Secondary | ICD-10-CM

## 2014-03-23 DIAGNOSIS — Z1389 Encounter for screening for other disorder: Secondary | ICD-10-CM

## 2014-03-23 LAB — POCT URINALYSIS DIPSTICK
Blood, UA: NEGATIVE
Glucose, UA: NEGATIVE
Ketones, UA: NEGATIVE
Leukocytes, UA: NEGATIVE
Nitrite, UA: NEGATIVE
Protein, UA: NEGATIVE

## 2014-03-23 NOTE — Progress Notes (Signed)
Low-risk OB appointment G2P1001 [redacted]w[redacted]d Estimated Date of Delivery: 05/17/14 BP 124/70 mmHg  Wt 236 lb (107.049 kg)  LMP 08/10/2013  BP, weight, and urine reviewed.  Refer to obstetrical flow sheet for FH & FHR.  Reports good fm.  Denies regular uc's, lof, vb, or uti s/s. Some occ cramping at night, nothing bad. Recommended increasing fluids.  Reviewed ptl s/s, fkc, normal pn2 results. Plan:  Continue routine obstetrical care  F/U in 2wks for OB appointment

## 2014-03-23 NOTE — Patient Instructions (Signed)
Call the office ([phone]) or go to [facility] if:  You begin to have strong, frequent contractions  Your water breaks.  Sometimes it is a big gush of fluid, sometimes it is just a trickle that keeps getting your panties wet or running down your legs  You have vaginal bleeding.  It is normal to have a small amount of spotting if your cervix was checked.   You don't feel your baby moving like normal.  If you don't, get you something to eat and drink and lay down and focus on feeling your baby move.  You should feel at least 10 movements in 2 hours.  If you don't, you should call the office or go to [facility].    Preterm Labor Information Preterm labor is when labor starts at less than 37 weeks of pregnancy. The normal length of a pregnancy is 39 to 41 weeks. CAUSES Often, there is no identifiable underlying cause as to why a woman goes into preterm labor. One of the most common known causes of preterm labor is infection. Infections of the uterus, cervix, vagina, amniotic sac, bladder, kidney, or even the lungs (pneumonia) can cause labor to start. Other suspected causes of preterm labor include:   Urogenital infections, such as yeast infections and bacterial vaginosis.   Uterine abnormalities (uterine shape, uterine septum, fibroids, or bleeding from the placenta).   A cervix that has been operated on (it may fail to stay closed).   Malformations in the fetus.   Multiple gestations (twins, triplets, and so on).   Breakage of the amniotic sac.  RISK FACTORS  Having a previous history of preterm labor.   Having premature rupture of membranes (PROM).   Having a placenta that covers the opening of the cervix (placenta previa).   Having a placenta that separates from the uterus (placental abruption).   Having a cervix that is too weak to hold the fetus in the uterus (incompetent cervix).   Having too much fluid in the amniotic sac (polyhydramnios).   Taking  illegal drugs or smoking while pregnant.   Not gaining enough weight while pregnant.   Being younger than 18 and older than 25 years old.   Having a low socioeconomic status.   Being African American. SYMPTOMS Signs and symptoms of preterm labor include:   Menstrual-like cramps, abdominal pain, or back pain.  Uterine contractions that are regular, as frequent as six in an hour, regardless of their intensity (may be mild or painful).  Contractions that start on the top of the uterus and spread down to the lower abdomen and back.   A sense of increased pelvic pressure.   A watery or bloody mucus discharge that comes from the vagina.  TREATMENT Depending on the length of the pregnancy and other circumstances, your health care provider may suggest bed rest. If necessary, there are medicines that can be given to stop contractions and to mature the fetal lungs. If labor happens before 34 weeks of pregnancy, a prolonged hospital stay may be recommended. Treatment depends on the condition of both you and the fetus.  WHAT SHOULD YOU DO IF YOU THINK YOU ARE IN PRETERM LABOR? Call your health care provider right away. You will need to go to the hospital to get checked immediately. HOW CAN YOU PREVENT PRETERM LABOR IN FUTURE PREGNANCIES? You should:   Stop smoking if you smoke.  Maintain healthy weight gain and avoid chemicals and drugs that are not necessary.  Be watchful for   any type of infection.  Inform your health care provider if you have a known history of preterm labor. Document Released: 06/02/2003 Document Revised: 11/12/2012 Document Reviewed: 04/14/2012 [facility] Patient Information 2015 [place], [place]. This information is not intended to replace advice given to you by your health care provider. Make sure you discuss any questions you have with your health care provider.

## 2014-03-24 ENCOUNTER — Telehealth: Payer: Self-pay | Admitting: Obstetrics & Gynecology

## 2014-03-24 NOTE — Telephone Encounter (Signed)
Spoke with pt. Pt has an eye infection and was prescribed Tobramycin eye drops, 1 drop QID x 5 days. Is this safe to use during pregnancy? Pt has used it one time today. Thanks!! [place]

## 2014-03-24 NOTE — Telephone Encounter (Signed)
I returned call and answered uestion "ok to use tobramycin"

## 2014-03-26 NOTE — L&D Delivery Note (Addendum)
Patient is 24 y.o. G2P1001 [redacted]w[redacted]d admitted in active labor, hx of 9lb9oz boy   Delivery Note At 1:22 PM a viable female was delivered via Vaginal, Spontaneous Delivery (Presentation: Left Occiput Anterior).  APGAR: 8, 9; weight 9lb9.6oz Placenta status: Intact, Spontaneous.  Cord: 3 vessels with the following complications: None.  Anesthesia: None  Episiotomy: None Lacerations: 2nd degree, very short Suture Repair: 3.0 vicryl rapide Est. Blood Loss (mL): 450 => cytotec 1053mcg PR 2/2 increased bleeding and macrosomia (suspected)  Mom to postpartum.  Baby to Couplet care / Skin to Skin.  Marquavion Venhuizen ROCIO 05/19/2014, 1:56 PM

## 2014-04-06 ENCOUNTER — Ambulatory Visit (INDEPENDENT_AMBULATORY_CARE_PROVIDER_SITE_OTHER): Payer: BLUE CROSS/BLUE SHIELD | Admitting: Women's Health

## 2014-04-06 ENCOUNTER — Encounter: Payer: Self-pay | Admitting: Women's Health

## 2014-04-06 VITALS — BP 122/56 | Wt 241.0 lb

## 2014-04-06 DIAGNOSIS — Z3483 Encounter for supervision of other normal pregnancy, third trimester: Secondary | ICD-10-CM

## 2014-04-06 DIAGNOSIS — Z331 Pregnant state, incidental: Secondary | ICD-10-CM

## 2014-04-06 DIAGNOSIS — Z1389 Encounter for screening for other disorder: Secondary | ICD-10-CM

## 2014-04-06 LAB — POCT URINALYSIS DIPSTICK
Blood, UA: NEGATIVE
GLUCOSE UA: NEGATIVE
Leukocytes, UA: NEGATIVE
Nitrite, UA: NEGATIVE
PROTEIN UA: NEGATIVE

## 2014-04-06 NOTE — Progress Notes (Signed)
Low-risk OB appointment G2P1001 [redacted]w[redacted]d Estimated Date of Delivery: 05/17/14 BP 122/56 mmHg  Wt 241 lb (109.317 kg)  LMP 08/10/2013  BP, weight, and urine reviewed.  Refer to obstetrical flow sheet for FH & FHR.  Reports good fm.  Denies regular uc's, lof, vb, or uti s/s. No complaints. Reviewed ptl s/s, fkc. Plan:  Continue routine obstetrical care  F/U in 2wks for OB appointment

## 2014-04-06 NOTE — Patient Instructions (Signed)
Call the office ([phone]) or go to [facility] if:  You begin to have strong, frequent contractions  Your water breaks.  Sometimes it is a big gush of fluid, sometimes it is just a trickle that keeps getting your panties wet or running down your legs  You have vaginal bleeding.  It is normal to have a small amount of spotting if your cervix was checked.   You don't feel your baby moving like normal.  If you don't, get you something to eat and drink and lay down and focus on feeling your baby move.  You should feel at least 10 movements in 2 hours.  If you don't, you should call the office or go to [facility].    Preterm Labor Information Preterm labor is when labor starts at less than 37 weeks of pregnancy. The normal length of a pregnancy is 39 to 41 weeks. CAUSES Often, there is no identifiable underlying cause as to why a woman goes into preterm labor. One of the most common known causes of preterm labor is infection. Infections of the uterus, cervix, vagina, amniotic sac, bladder, kidney, or even the lungs (pneumonia) can cause labor to start. Other suspected causes of preterm labor include:   Urogenital infections, such as yeast infections and bacterial vaginosis.   Uterine abnormalities (uterine shape, uterine septum, fibroids, or bleeding from the placenta).   A cervix that has been operated on (it may fail to stay closed).   Malformations in the fetus.   Multiple gestations (twins, triplets, and so on).   Breakage of the amniotic sac.  RISK FACTORS  Having a previous history of preterm labor.   Having premature rupture of membranes (PROM).   Having a placenta that covers the opening of the cervix (placenta previa).   Having a placenta that separates from the uterus (placental abruption).   Having a cervix that is too weak to hold the fetus in the uterus (incompetent cervix).   Having too much fluid in the amniotic sac (polyhydramnios).   Taking  illegal drugs or smoking while pregnant.   Not gaining enough weight while pregnant.   Being younger than 18 and older than 26 years old.   Having a low socioeconomic status.   Being African American. SYMPTOMS Signs and symptoms of preterm labor include:   Menstrual-like cramps, abdominal pain, or back pain.  Uterine contractions that are regular, as frequent as six in an hour, regardless of their intensity (may be mild or painful).  Contractions that start on the top of the uterus and spread down to the lower abdomen and back.   A sense of increased pelvic pressure.   A watery or bloody mucus discharge that comes from the vagina.  TREATMENT Depending on the length of the pregnancy and other circumstances, your health care provider may suggest bed rest. If necessary, there are medicines that can be given to stop contractions and to mature the fetal lungs. If labor happens before 34 weeks of pregnancy, a prolonged hospital stay may be recommended. Treatment depends on the condition of both you and the fetus.  WHAT SHOULD YOU DO IF YOU THINK YOU ARE IN PRETERM LABOR? Call your health care provider right away. You will need to go to the hospital to get checked immediately. HOW CAN YOU PREVENT PRETERM LABOR IN FUTURE PREGNANCIES? You should:   Stop smoking if you smoke.  Maintain healthy weight gain and avoid chemicals and drugs that are not necessary.  Be watchful for   any type of infection.  Inform your health care provider if you have a known history of preterm labor. Document Released: 06/02/2003 Document Revised: 11/12/2012 Document Reviewed: 04/14/2012 [facility] Patient Information 2015 [place], [place]. This information is not intended to replace advice given to you by your health care provider. Make sure you discuss any questions you have with your health care provider.

## 2014-04-15 ENCOUNTER — Telehealth: Payer: Self-pay | Admitting: Advanced Practice Midwife

## 2014-04-15 DIAGNOSIS — Z3482 Encounter for supervision of other normal pregnancy, second trimester: Secondary | ICD-10-CM

## 2014-04-15 NOTE — Telephone Encounter (Signed)
Sinus issues. Pt advised to try regular sudafed, saline nasal spray and tylenol. If not better call us back. PT verbalized understanding.

## 2014-04-20 ENCOUNTER — Ambulatory Visit (INDEPENDENT_AMBULATORY_CARE_PROVIDER_SITE_OTHER): Payer: BLUE CROSS/BLUE SHIELD | Admitting: Advanced Practice Midwife

## 2014-04-20 VITALS — BP 112/64 | Wt 242.0 lb

## 2014-04-20 DIAGNOSIS — Z3483 Encounter for supervision of other normal pregnancy, third trimester: Secondary | ICD-10-CM

## 2014-04-20 DIAGNOSIS — Z1389 Encounter for screening for other disorder: Secondary | ICD-10-CM

## 2014-04-20 DIAGNOSIS — Z331 Pregnant state, incidental: Secondary | ICD-10-CM

## 2014-04-20 LAB — POCT URINALYSIS DIPSTICK
Glucose, UA: NEGATIVE
Ketones, UA: NEGATIVE
NITRITE UA: NEGATIVE
Protein, UA: NEGATIVE
RBC UA: NEGATIVE

## 2014-04-20 NOTE — Progress Notes (Signed)
G2P1001 [redacted]w[redacted]d Estimated Date of Delivery: 05/17/14  Blood pressure 112/64, weight 242 lb (109.77 kg), last menstrual period 08/10/2013.   BP weight and urine results all reviewed and noted.  Please refer to the obstetrical flow sheet for the fundal height and fetal heart rate documentation:  Patient reports good fetal movement, denies any bleeding and no rupture of membranes symptoms or regular contractions. Patient is without complaints. All questions were answered.  Plan:  Continued routine obstetrical care,   Follow up in 1 weeks for OB appointment, GBS

## 2014-04-27 ENCOUNTER — Encounter: Payer: Self-pay | Admitting: Obstetrics & Gynecology

## 2014-04-27 ENCOUNTER — Ambulatory Visit (INDEPENDENT_AMBULATORY_CARE_PROVIDER_SITE_OTHER): Payer: BLUE CROSS/BLUE SHIELD | Admitting: Obstetrics & Gynecology

## 2014-04-27 VITALS — BP 110/60 | Wt 244.0 lb

## 2014-04-27 DIAGNOSIS — Z3483 Encounter for supervision of other normal pregnancy, third trimester: Secondary | ICD-10-CM

## 2014-04-27 DIAGNOSIS — Z118 Encounter for screening for other infectious and parasitic diseases: Secondary | ICD-10-CM

## 2014-04-27 DIAGNOSIS — Z1389 Encounter for screening for other disorder: Secondary | ICD-10-CM

## 2014-04-27 DIAGNOSIS — Z331 Pregnant state, incidental: Secondary | ICD-10-CM

## 2014-04-27 DIAGNOSIS — Z1159 Encounter for screening for other viral diseases: Secondary | ICD-10-CM

## 2014-04-27 DIAGNOSIS — Z3685 Encounter for antenatal screening for Streptococcus B: Secondary | ICD-10-CM

## 2014-04-27 LAB — POCT URINALYSIS DIPSTICK
Glucose, UA: NEGATIVE
Ketones, UA: NEGATIVE
Leukocytes, UA: NEGATIVE
NITRITE UA: NEGATIVE
PROTEIN UA: NEGATIVE
RBC UA: NEGATIVE

## 2014-04-27 LAB — OB RESULTS CONSOLE GC/CHLAMYDIA
Chlamydia: NEGATIVE
Gonorrhea: NEGATIVE

## 2014-04-27 LAB — OB RESULTS CONSOLE GBS: GBS: NEGATIVE

## 2014-04-27 NOTE — Addendum Note (Signed)
Addended by: Doyne Keel on: 04/27/2014 09:49 AM   Modules accepted: Orders

## 2014-04-27 NOTE — Progress Notes (Signed)
G2P1001 [redacted]w[redacted]d Estimated Date of Delivery: 05/17/14  Blood pressure 110/60, weight 244 lb (110.678 kg), last menstrual period 08/10/2013.   BP weight and urine results all reviewed and noted.  Please refer to the obstetrical flow sheet for the fundal height and fetal heart rate documentation:  Patient reports good fetal movement, denies any bleeding and no rupture of membranes symptoms or regular contractions. Patient is without complaints. All questions were answered.  Plan:  Continued routine obstetrical care,   Follow up in 1 weeks for OB appointment, routine

## 2014-04-29 LAB — GC/CHLAMYDIA PROBE AMP
CHLAMYDIA, DNA PROBE: NEGATIVE
Neisseria gonorrhoeae by PCR: NEGATIVE

## 2014-05-01 LAB — CULTURE, BETA STREP (GROUP B ONLY): Strep Gp B Culture: NEGATIVE

## 2014-05-05 ENCOUNTER — Encounter: Payer: Self-pay | Admitting: Obstetrics & Gynecology

## 2014-05-05 ENCOUNTER — Ambulatory Visit (INDEPENDENT_AMBULATORY_CARE_PROVIDER_SITE_OTHER): Payer: BLUE CROSS/BLUE SHIELD | Admitting: Obstetrics & Gynecology

## 2014-05-05 VITALS — BP 110/60 | Wt 242.0 lb

## 2014-05-05 DIAGNOSIS — Z1389 Encounter for screening for other disorder: Secondary | ICD-10-CM

## 2014-05-05 DIAGNOSIS — Z3483 Encounter for supervision of other normal pregnancy, third trimester: Secondary | ICD-10-CM

## 2014-05-05 DIAGNOSIS — Z331 Pregnant state, incidental: Secondary | ICD-10-CM

## 2014-05-05 LAB — POCT URINALYSIS DIPSTICK
Blood, UA: NEGATIVE
Glucose, UA: NEGATIVE
Ketones, UA: NEGATIVE
NITRITE UA: NEGATIVE
Protein, UA: NEGATIVE

## 2014-05-05 NOTE — Progress Notes (Signed)
G2P1001 [redacted]w[redacted]d Estimated Date of Delivery: 05/17/14  Blood pressure 110/60, weight 242 lb (109.77 kg), last menstrual period 08/10/2013.   BP weight and urine results all reviewed and noted.  Please refer to the obstetrical flow sheet for the fundal height and fetal heart rate documentation:  Patient reports good fetal movement, denies any bleeding and no rupture of membranes symptoms or regular contractions. Patient is without complaints. All questions were answered.  Plan:  Continued routine obstetrical care,   Follow up in 1 weeks for OB appointment, routine

## 2014-05-12 ENCOUNTER — Encounter: Payer: Self-pay | Admitting: Obstetrics & Gynecology

## 2014-05-12 ENCOUNTER — Ambulatory Visit (INDEPENDENT_AMBULATORY_CARE_PROVIDER_SITE_OTHER): Payer: BLUE CROSS/BLUE SHIELD | Admitting: Obstetrics & Gynecology

## 2014-05-12 VITALS — BP 108/60 | Wt 246.0 lb

## 2014-05-12 DIAGNOSIS — Z331 Pregnant state, incidental: Secondary | ICD-10-CM

## 2014-05-12 DIAGNOSIS — Z3483 Encounter for supervision of other normal pregnancy, third trimester: Secondary | ICD-10-CM

## 2014-05-12 DIAGNOSIS — Z1389 Encounter for screening for other disorder: Secondary | ICD-10-CM

## 2014-05-12 LAB — POCT URINALYSIS DIPSTICK
Blood, UA: NEGATIVE
Glucose, UA: NEGATIVE
Ketones, UA: NEGATIVE
Nitrite, UA: NEGATIVE
PROTEIN UA: NEGATIVE

## 2014-05-12 NOTE — Progress Notes (Signed)
G2P1001 [redacted]w[redacted]d Estimated Date of Delivery: 05/17/14  Blood pressure 108/60, weight 246 lb (111.585 kg), last menstrual period 08/10/2013.   BP weight and urine results all reviewed and noted.  Please refer to the obstetrical flow sheet for the fundal height and fetal heart rate documentation:  Patient reports good fetal movement, denies any bleeding and no rupture of membranes symptoms or regular contractions. Patient is without complaints. All questions were answered.  Plan:  Continued routine obstetrical care,   Follow up in 1 weeks for OB appointment, routine

## 2014-05-17 ENCOUNTER — Encounter: Payer: Self-pay | Admitting: Women's Health

## 2014-05-17 ENCOUNTER — Ambulatory Visit (INDEPENDENT_AMBULATORY_CARE_PROVIDER_SITE_OTHER): Payer: BLUE CROSS/BLUE SHIELD | Admitting: Women's Health

## 2014-05-17 VITALS — BP 118/66 | Wt 244.0 lb

## 2014-05-17 DIAGNOSIS — Z3483 Encounter for supervision of other normal pregnancy, third trimester: Secondary | ICD-10-CM | POA: Diagnosis not present

## 2014-05-17 DIAGNOSIS — Z1389 Encounter for screening for other disorder: Secondary | ICD-10-CM

## 2014-05-17 DIAGNOSIS — Z331 Pregnant state, incidental: Secondary | ICD-10-CM

## 2014-05-17 LAB — POCT URINALYSIS DIPSTICK
Glucose, UA: NEGATIVE
Ketones, UA: NEGATIVE
Nitrite, UA: NEGATIVE
PROTEIN UA: NEGATIVE
RBC UA: NEGATIVE

## 2014-05-17 NOTE — Patient Instructions (Signed)
Your induction is scheduled for 2/29 @ 7:30pm. Go to [facility] hospital, Maternity Admissions Unit (Emergency) entrance and let them know you are there to be induced. They will send someone from Labor & Delivery to come get you.    Call the office [phone]) or go to [facility] if:  You begin to have strong, frequent contractions  Your water breaks.  Sometimes it is a big gush of fluid, sometimes it is just a trickle that keeps getting your panties wet or running down your legs  You have vaginal bleeding.  It is normal to have a small amount of spotting if your cervix was checked.   You don't feel your baby moving like normal.  If you don't, get you something to eat and drink and lay down and focus on feeling your baby move.  You should feel at least 10 movements in 2 hours.  If you don't, you should call the office or go to [place] Contractions Contractions of the uterus can occur throughout pregnancy. Contractions are not always a sign that you are in labor.  WHAT ARE BRAXTON HICKS CONTRACTIONS?  Contractions that occur before labor are called Braxton Hicks contractions, or false labor. Toward the end of pregnancy (32-34 weeks), these contractions can develop more often and may become more forceful. This is not true labor because these contractions do not result in opening (dilatation) and thinning of the cervix. They are sometimes difficult to tell apart from true labor because these contractions can be forceful and people have different pain tolerances. You should not feel embarrassed if you go to the hospital with false labor. Sometimes, the only way to tell if you are in true labor is for your health care provider to look for changes in the cervix. If there are no prenatal problems or other health problems associated with the pregnancy, it is completely safe to be sent home with false labor and await the onset of true labor. HOW CAN YOU TELL THE DIFFERENCE  BETWEEN TRUE AND FALSE LABOR? False Labor  The contractions of false labor are usually shorter and not as hard as those of true labor.   The contractions are usually irregular.   The contractions are often felt in the front of the lower abdomen and in the groin.   The contractions may go away when you walk around or change positions while lying down.   The contractions get weaker and are shorter lasting as time goes on.   The contractions do not usually become progressively stronger, regular, and closer together as with true labor.  True Labor  Contractions in true labor last 30-70 seconds, become very regular, usually become more intense, and increase in frequency.   The contractions do not go away with walking.   The discomfort is usually felt in the top of the uterus and spreads to the lower abdomen and low back.   True labor can be determined by your health care provider with an exam. This will show that the cervix is dilating and getting thinner.  WHAT TO REMEMBER  Keep up with your usual exercises and follow other instructions given by your health care provider.   Take medicines as directed by your health care provider.   Keep your regular prenatal appointments.   Eat and drink lightly if you think you are going into labor.   If Braxton Hicks contractions are making you uncomfortable:   Change your position from lying down or  resting to walking, or from walking to resting.   Sit and rest in a tub of warm water.   Drink 2-3 glasses of water. Dehydration may cause these contractions.   Do slow and deep breathing several times an hour.  WHEN SHOULD I SEEK IMMEDIATE MEDICAL CARE? Seek immediate medical care if:  Your contractions become stronger, more regular, and closer together.   You have fluid leaking or gushing from your vagina.   You have a fever.   You pass blood-tinged mucus.   You have vaginal bleeding.   You have continuous  abdominal pain.   You have low back pain that you never had before.   You feel your baby's head pushing down and causing pelvic pressure.   Your baby is not moving as much as it used to.  Document Released: 03/12/2005 Document Revised: 03/17/2013 Document Reviewed: 12/22/2012 [facility] Patient Information 2015 [place], [place]. This information is not intended to replace advice given to you by your health care provider. Make sure you discuss any questions you have with your health care provider.

## 2014-05-17 NOTE — Progress Notes (Signed)
Low-risk OB appointment G2P1001 [redacted]w[redacted]d Estimated Date of Delivery: 05/17/14 BP 118/66 mmHg  Wt 244 lb (110.678 kg)  LMP 08/10/2013  BP, weight, and urine reviewed.  Refer to obstetrical flow sheet for FH & FHR.  Reports good fm.  Denies regular uc's, lof, vb, or uti s/s. No complaints.  SVE per request: 3/50/-3, vtx, offered membrane sweeping, declines Reviewed labor s/s, fkc. IOL for PD scheduled for 2/29 @ 1930 Plan:  Continue routine obstetrical care, f/u 4-6wks for pp visit

## 2014-05-19 ENCOUNTER — Inpatient Hospital Stay (HOSPITAL_COMMUNITY): Payer: BC Managed Care – PPO | Admitting: Anesthesiology

## 2014-05-19 ENCOUNTER — Inpatient Hospital Stay (HOSPITAL_COMMUNITY)
Admission: AD | Admit: 2014-05-19 | Discharge: 2014-05-20 | DRG: 775 | Disposition: A | Discharge status: Home / Self Care | Payer: BC Managed Care – PPO | Source: Ambulatory Visit | Attending: Obstetrics & Gynecology | Admitting: Obstetrics & Gynecology

## 2014-05-19 ENCOUNTER — Encounter (HOSPITAL_COMMUNITY): Payer: Self-pay | Admitting: General Practice

## 2014-05-19 DIAGNOSIS — O471 False labor at or after 37 completed weeks of gestation: Secondary | ICD-10-CM | POA: Diagnosis present

## 2014-05-19 DIAGNOSIS — Z833 Family history of diabetes mellitus: Secondary | ICD-10-CM

## 2014-05-19 DIAGNOSIS — Z3A40 40 weeks gestation of pregnancy: Secondary | ICD-10-CM | POA: Diagnosis present

## 2014-05-19 DIAGNOSIS — Z3483 Encounter for supervision of other normal pregnancy, third trimester: Secondary | ICD-10-CM

## 2014-05-19 DIAGNOSIS — Z9049 Acquired absence of other specified parts of digestive tract: Secondary | ICD-10-CM | POA: Diagnosis present

## 2014-05-19 DIAGNOSIS — IMO0001 Reserved for inherently not codable concepts without codable children: Secondary | ICD-10-CM

## 2014-05-19 LAB — CBC
HCT: 44.1 % (ref 36.0–46.0)
Hemoglobin: 15 g/dL (ref 12.0–15.0)
MCH: 30.6 pg (ref 26.0–34.0)
MCHC: 34 g/dL (ref 30.0–36.0)
MCV: 90 fL (ref 78.0–100.0)
PLATELETS: 173 10*3/uL (ref 150–400)
RBC: 4.9 MIL/uL (ref 3.87–5.11)
RDW: 14.3 % (ref 11.5–15.5)
WBC: 15.5 10*3/uL — AB (ref 4.0–10.5)

## 2014-05-19 LAB — TYPE AND SCREEN
ABO/RH(D): A POS
ANTIBODY SCREEN: NEGATIVE

## 2014-05-19 LAB — ABO/RH: ABO/RH(D): A POS

## 2014-05-19 MED ORDER — SODIUM CHLORIDE 0.9 % IJ SOLN
3.0000 mL | Freq: Two times a day (BID) | INTRAMUSCULAR | Status: DC
  Administered 2014-05-19: 3 mL via INTRAVENOUS

## 2014-05-19 MED ORDER — LACTATED RINGERS IV SOLN: 500.0000 mL | INTRAVENOUS | Status: DC | PRN

## 2014-05-19 MED ORDER — IBUPROFEN 600 MG PO TABS
600.0000 mg | ORAL_TABLET | Freq: Four times a day (QID) | ORAL | Status: DC
  Administered 2014-05-19 – 2014-05-20 (×4): 600 mg via ORAL
  Filled 2014-05-19 (×4): qty 1

## 2014-05-19 MED ORDER — ONDANSETRON HCL 4 MG/2ML IJ SOLN
4.0000 mg | INTRAMUSCULAR | Status: DC | PRN
Start: 2014-05-19 — End: 2014-05-20

## 2014-05-19 MED ORDER — PRENATAL MULTIVITAMIN CH
1.0000 | ORAL_TABLET | Freq: Every day | ORAL | Status: DC
  Administered 2014-05-20: 1 via ORAL
  Filled 2014-05-19: qty 1

## 2014-05-19 MED ORDER — ZOLPIDEM TARTRATE 5 MG PO TABS: 5.0000 mg | ORAL_TABLET | Freq: Every evening | ORAL | Status: DC | PRN

## 2014-05-19 MED ORDER — SENNOSIDES-DOCUSATE SODIUM 8.6-50 MG PO TABS
2.0000 | ORAL_TABLET | ORAL | Status: DC
  Administered 2014-05-19: 2 via ORAL
  Filled 2014-05-19: qty 2

## 2014-05-19 MED ORDER — MISOPROSTOL 200 MCG PO TABS
ORAL_TABLET | ORAL | Status: AC
  Filled 2014-05-19: qty 5

## 2014-05-19 MED ORDER — CITRIC ACID-SODIUM CITRATE 334-500 MG/5ML PO SOLN: 30.0000 mL | ORAL | Status: DC | PRN

## 2014-05-19 MED ORDER — EPHEDRINE 5 MG/ML INJ
10.0000 mg | INTRAVENOUS | Status: DC | PRN
  Filled 2014-05-19: qty 2

## 2014-05-19 MED ORDER — TETANUS-DIPHTH-ACELL PERTUSSIS 5-2.5-18.5 LF-MCG/0.5 IM SUSP
0.5000 mL | Freq: Once | INTRAMUSCULAR | Status: AC
  Administered 2014-05-19: 0.5 mL via INTRAMUSCULAR

## 2014-05-19 MED ORDER — ACETAMINOPHEN 325 MG PO TABS: 650.0000 mg | ORAL_TABLET | ORAL | Status: DC | PRN

## 2014-05-19 MED ORDER — OXYCODONE-ACETAMINOPHEN 5-325 MG PO TABS: 1.0000 | ORAL_TABLET | ORAL | Status: DC | PRN

## 2014-05-19 MED ORDER — MISOPROSTOL 200 MCG PO TABS
1000.0000 ug | ORAL_TABLET | Freq: Once | ORAL | Status: AC
  Administered 2014-05-19: 1000 ug via RECTAL

## 2014-05-19 MED ORDER — ONDANSETRON HCL 4 MG/2ML IJ SOLN: 4.0000 mg | Freq: Four times a day (QID) | INTRAMUSCULAR | Status: DC | PRN

## 2014-05-19 MED ORDER — FLEET ENEMA 7-19 GM/118ML RE ENEM: 1.0000 | ENEMA | Freq: Every day | RECTAL | Status: DC | PRN

## 2014-05-19 MED ORDER — LACTATED RINGERS IV SOLN
500.0000 mL | Freq: Once | INTRAVENOUS | Status: AC
  Administered 2014-05-19: 500 mL via INTRAVENOUS

## 2014-05-19 MED ORDER — BISACODYL 10 MG RE SUPP: 10.0000 mg | Freq: Every day | RECTAL | Status: DC | PRN

## 2014-05-19 MED ORDER — PHENYLEPHRINE 40 MCG/ML (10ML) SYRINGE FOR IV PUSH (FOR BLOOD PRESSURE SUPPORT)
80.0000 ug | PREFILLED_SYRINGE | INTRAVENOUS | Status: DC | PRN
  Filled 2014-05-19: qty 2

## 2014-05-19 MED ORDER — DIBUCAINE 1 % RE OINT: 1.0000 "application " | TOPICAL_OINTMENT | RECTAL | Status: DC | PRN

## 2014-05-19 MED ORDER — DIPHENHYDRAMINE HCL 50 MG/ML IJ SOLN: 12.5000 mg | INTRAMUSCULAR | Status: DC | PRN

## 2014-05-19 MED ORDER — LANOLIN HYDROUS EX OINT: TOPICAL_OINTMENT | CUTANEOUS | Status: DC | PRN

## 2014-05-19 MED ORDER — LIDOCAINE HCL (PF) 1 % IJ SOLN
30.0000 mL | INTRAMUSCULAR | Status: DC | PRN
  Filled 2014-05-19: qty 30

## 2014-05-19 MED ORDER — PHENYLEPHRINE 40 MCG/ML (10ML) SYRINGE FOR IV PUSH (FOR BLOOD PRESSURE SUPPORT)
80.0000 ug | PREFILLED_SYRINGE | INTRAVENOUS | Status: DC | PRN
  Filled 2014-05-19: qty 2
  Filled 2014-05-19: qty 20

## 2014-05-19 MED ORDER — DIPHENHYDRAMINE HCL 25 MG PO CAPS: 25.0000 mg | ORAL_CAPSULE | Freq: Four times a day (QID) | ORAL | Status: DC | PRN

## 2014-05-19 MED ORDER — OXYTOCIN 40 UNITS IN LACTATED RINGERS INFUSION - SIMPLE MED: 62.5000 mL/h | INTRAVENOUS | Status: DC | PRN

## 2014-05-19 MED ORDER — OXYTOCIN 40 UNITS IN LACTATED RINGERS INFUSION - SIMPLE MED
62.5000 mL/h | INTRAVENOUS | Status: DC
  Filled 2014-05-19: qty 1000

## 2014-05-19 MED ORDER — FLEET ENEMA 7-19 GM/118ML RE ENEM: 1.0000 | ENEMA | RECTAL | Status: DC | PRN

## 2014-05-19 MED ORDER — FENTANYL 2.5 MCG/ML BUPIVACAINE 1/10 % EPIDURAL INFUSION (WH - ANES)
14.0000 mL/h | INTRAMUSCULAR | Status: DC | PRN
  Administered 2014-05-19: 14 mL/h via EPIDURAL
  Filled 2014-05-19: qty 125

## 2014-05-19 MED ORDER — BENZOCAINE-MENTHOL 20-0.5 % EX AERO
1.0000 "application " | INHALATION_SPRAY | CUTANEOUS | Status: DC | PRN
  Administered 2014-05-19: 1 via TOPICAL
  Filled 2014-05-19: qty 56

## 2014-05-19 MED ORDER — SODIUM CHLORIDE 0.9 % IV SOLN: 250.0000 mL | INTRAVENOUS | Status: DC | PRN

## 2014-05-19 MED ORDER — SODIUM CHLORIDE 0.9 % IJ SOLN: 3.0000 mL | INTRAMUSCULAR | Status: DC | PRN

## 2014-05-19 MED ORDER — OXYTOCIN BOLUS FROM INFUSION
500.0000 mL | INTRAVENOUS | Status: DC
  Administered 2014-05-19: 500 mL via INTRAVENOUS

## 2014-05-19 MED ORDER — SIMETHICONE 80 MG PO CHEW: 80.0000 mg | CHEWABLE_TABLET | ORAL | Status: DC | PRN

## 2014-05-19 MED ORDER — LACTATED RINGERS IV SOLN
INTRAVENOUS | Status: DC
Start: 2014-05-19 — End: 2014-05-19
  Administered 2014-05-19: 11:00:00 via INTRAVENOUS

## 2014-05-19 MED ORDER — WITCH HAZEL-GLYCERIN EX PADS: 1.0000 "application " | MEDICATED_PAD | CUTANEOUS | Status: DC | PRN

## 2014-05-19 MED ORDER — OXYCODONE-ACETAMINOPHEN 5-325 MG PO TABS: 2.0000 | ORAL_TABLET | ORAL | Status: DC | PRN

## 2014-05-19 MED ORDER — ONDANSETRON HCL 4 MG PO TABS: 4.0000 mg | ORAL_TABLET | ORAL | Status: DC | PRN

## 2014-05-19 NOTE — H&P (Signed)
LABOR ADMISSION HISTORY AND PHYSICAL  Caia Lofaro is a 24 y.o. female G2P1001 with IUP at [redacted]w[redacted]d by LMP presenting for in active labor. She reports +FMs, No LOF, no VB, no blurry vision, headaches or peripheral edema, and RUQ pain.  She plans on breast feeding. She request POPs for birth control.  Dating: By LMP c/w [redacted]w[redacted]d sono --->  Estimated Date of Delivery: 05/17/14   Prenatal History/Complications:  Past Medical History: Past Medical History  Diagnosis Date  . 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 Anemia   . Cholelithiasis with acute or chronic cholecystitis     Cholecystectomy 11/25/12  . Vaginal discharge 11/11/2013  . Yeast infection 11/11/2013    Past Surgical History: Past Surgical History  Procedure Laterality Date  . Foot surgery  age [age] yrs    arches lowered on both feet: hx of both feet with chronic pain and episodes of turning blue  . Cholecystectomy N/A 11/25/2012    Procedure: LAPAROSCOPIC CHOLECYSTECTOMY WITH INTRAOPERATIVE CHOLANGIOGRAM;  Surgeon: Madilyn Hook, DO;  Location: [facility];  Service: General;  Laterality: N/A;    Obstetrical History: OB History    Gravida Para Term Preterm AB TAB SAB Ectopic Multiple Living   2 1 1  0 0 0 0 0 0 1      Social History: History   Social History  . Marital Status: Married    Spouse Name: N/A  . Number of Children: N/A  . Years of Education: N/A   Social History Main Topics  . Smoking status: Never Smoker   . Smokeless tobacco: Never Used  . Alcohol Use: No     Comment: occassionally  . Drug Use: No  . Sexual Activity: Yes    Birth Control/ Protection: None   Other Topics Concern  . None   Social History Narrative   Married and lives in [place] with husband and 61 mo old son.   Homemaker.   HS: [employer].   Beauty school.     No Tob.  Occ alc.  No drugs.   Exercise: used to walk, o/w none.    Family History: Family History  Problem Relation Age of Onset  . Fibromyalgia  Mother   . Fibromyalgia Sister   . Diabetes Maternal Grandmother   . Diabetes Paternal Grandmother   . Cancer Paternal Grandfather     lungs    Allergies: No Known Allergies  Prescriptions prior to admission  Medication Sig Dispense Refill Last Dose  . Prenatal Vit-Fe Fumarate-FA (MULTIVITAMIN-PRENATAL) 27-0.8 MG TABS tablet Take 1 tablet by mouth daily at 12 noon.   05/18/2014 at Unknown time  . fluconazole (DIFLUCAN) 150 MG tablet TAKE 1 TABLET NOW THEN TAKE 1 TABLET IN 3 DAYS IF NEEDED (Patient not taking: Reported on 05/19/2014) 2 tablet 1 Not Taking  . omeprazole (PRILOSEC) 20 MG capsule Take 1 capsule (20 mg total) by mouth daily. 1 tablet a day (Patient not taking: Reported on 05/19/2014) 30 capsule 6 Not Taking     Review of Systems   All systems reviewed and negative except as stated in HPI  Blood pressure 133/66, pulse 87, temperature 97.7 F (36.5 C), temperature source Oral, resp. rate 20, height 5\' 9"  (1.753 m), weight 243 lb 3.2 oz (110.315 kg), last menstrual period 08/10/2013, SpO2 98 %. General appearance: alert, cooperative Lungs: clear to auscultation bilaterally Heart: regular rate and rhythm Abdomen: soft, non-tender; bowel sounds normal Extremities: Homans sign is negative,  no sign of DVT DTR 2+ Presentation: cephalic Fetal monitoringBaseline: 155 bpm, Variability: Good {> 6 bpm), Accelerations: Reactive and Decelerations: Absent Uterine activityq2-43min  Dilation: 4.5 Effacement (%): 80, 90 Station: -1 Exam by:: cheryl motte, rn   Prenatal labs: ABO, Rh: --/--/A POS (02/24 1030) Antibody: NEG (02/24 1030) Rubella:   RPR: NON REAC (11/24 0907)  HBsAg: NEGATIVE (07/29 1556)  HIV: NONREACTIVE (11/24 0907)  GBS: Negative (02/02 0000)  2 hr Glucola 77/117/86 Genetic screening  declined Anatomy US normal   [facility]  FOB [place], [place], 2nd baby, married  Dating By LMP c/w 8.4wk u/s  Pap 10/21/13: neg  GC/CT Initial:    -/-             36+wks:  Genetic Screen NT/IT: declined  CF screen declined  Anatomic [place] female '[place]'  Flu vaccine 01/20/14  Tdap Recommended ~ 28wks  Glucose Screen  2 hr  GBS negative  Feed Preference breast  Contraception POPs  Circumcision n/a  Childbirth Classes declined  Pediatrician [place]      Results for orders placed or performed during the hospital encounter of 05/19/14 (from the past 24 hour(s))  CBC   Collection Time: 05/19/14 10:30 AM  Result Value Ref Range   WBC 15.5 (H) 4.0 - 10.5 K/uL   RBC 4.90 3.87 - 5.11 MIL/uL   Hemoglobin 15.0 12.0 - 15.0 g/dL   HCT 44.1 36.0 - 46.0 %   MCV 90.0 78.0 - 100.0 fL   MCH 30.6 26.0 - 34.0 pg   MCHC 34.0 30.0 - 36.0 g/dL   RDW 14.3 11.5 - 15.5 %   Platelets 173 150 - 400 K/uL  Type and screen   Collection Time: 05/19/14 10:30 AM  Result Value Ref Range   ABO/RH(D) A POS    Antibody Screen NEG    Sample Expiration 05/22/2014     Patient Active Problem List   Diagnosis Date Noted  . Active labor at term 05/19/2014  . Encounter for supervision of normal pregnancy in multigravida in third trimester 03/09/2014  . Vaginal discharge 11/11/2013  . Yeast infection 11/11/2013  . Supervision of other normal pregnancy 10/21/2013  . Viral URI 07/02/2013  . Acute otitis media 07/02/2013  . Myofascial pain 10/20/2012  . Thyromegaly 10/20/2012  . Abdominal tenderness, epigastric 10/20/2012    Assessment: Janelly Switalski is a 24 y.o. G2P1001 at [redacted]w[redacted]d here in active la andbor  #Labor: expectais nt mgmt #Pain: Epidural upon request #FWB: Cat I # and and and and and ID:  GBS neg #MOF: breast #MOC: POPs  Taci Sterling ROCIO 05/19/2014, 11:57 AM

## 2014-05-19 NOTE — MAU Note (Signed)
Pt presents to MAU for routine labor check

## 2014-05-19 NOTE — Lactation Note (Signed)
This note was copied from the chart of Girl Racheal Mathurin. Lactation Consultation Note  Patient Name: Girl Cici Rodriges [id number] Date: 05/19/2014 Reason for consult: Initial assessment of this mom and baby at 44 hours pp.  Mom breastfed her first baby for 6 weeks but states she hopes to nurse her new baby longer, if possible.  Baby has just stopped breastfeeding for 20 minutes, per mom and she reports "this is best feeding so far."  LC reviewed hand expression, and encouraged frequent STS and cue feedings.  Mom encouraged to feed baby 8-12 times/24 hours and with feeding cues. LC encouraged review of Baby and Me pp 9, 14 and 20-25 for STS and BF information. LC provided [employer] brochure and reviewed [facility] services and list of community and web site resources.    Maternal Data Formula Feeding for Exclusion: No Has patient been taught Hand Expression?: Yes (LC demonstrated hand expression technique) Does the patient have breastfeeding experience prior to this delivery?: Yes  Feeding Feeding Type: Breast Fed Length of feed: 13 min  LATCH Score/Interventions         Multiple attempts and a recent 20 minute feeding but no LATCH score, although mom denies nipple pain with feeding             Lactation Tools Discussed/Used   STS, cue feedings, hand expression  Consult Status Consult Status: Follow-up Date: 05/20/14 Follow-up type: In-patient    Junious Dresser [facility] 05/19/2014, 10:45 PM

## 2014-05-19 NOTE — Anesthesia Preprocedure Evaluation (Signed)
Anesthesia Evaluation  Patient identified by MRN, date of birth, ID band Patient awake    Reviewed: Allergy & Precautions, H&P , NPO status , Patient's Chart, lab work & pertinent test results  Airway Mallampati: II  TM Distance: >3 FB Neck ROM: full    Dental no notable dental hx.    Pulmonary neg pulmonary ROS,    Pulmonary exam normal       Cardiovascular negative cardio ROS      Neuro/Psych negative psych ROS   GI/Hepatic negative GI ROS, Neg liver ROS,   Endo/Other  negative endocrine ROS  Renal/GU negative Renal ROS     Musculoskeletal   Abdominal (+) + obese,   Peds  Hematology negative hematology ROS (+)   Anesthesia Other Findings   Reproductive/Obstetrics (+) Pregnancy                             Anesthesia Physical Anesthesia Plan  ASA: II  Anesthesia Plan: Epidural   Post-op Pain Management:    Induction:   Airway Management Planned:   Additional Equipment:   Intra-op Plan:   Post-operative Plan:   Informed Consent: I have reviewed the patients History and Physical, chart, labs and discussed the procedure including the risks, benefits and alternatives for the proposed anesthesia with the patient or authorized representative who has indicated his/her understanding and acceptance.     Plan Discussed with:   Anesthesia Plan Comments:         Anesthesia Quick Evaluation

## 2014-05-20 LAB — RPR: RPR Ser Ql: NONREACTIVE

## 2014-05-20 MED ORDER — IBUPROFEN 600 MG PO TABS: 600.0000 mg | ORAL_TABLET | Freq: Four times a day (QID) | ORAL | Status: DC | PRN

## 2014-05-20 MED ORDER — FENTANYL 2.5 MCG/ML BUPIVACAINE 1/10 % EPIDURAL INFUSION (WH - ANES)
INTRAMUSCULAR | Status: DC | PRN
  Administered 2014-05-19: 14 mL/h via EPIDURAL

## 2014-05-20 MED ORDER — LIDOCAINE HCL (PF) 1 % IJ SOLN
INTRAMUSCULAR | Status: DC | PRN
Start: 2014-05-19 — End: 2014-05-20
  Administered 2014-05-19 (×2): 8 mL

## 2014-05-20 NOTE — Discharge Instructions (Signed)

## 2014-05-20 NOTE — Anesthesia Procedure Notes (Signed)
Epidural Patient location during procedure: OB Start time: 05/19/2014 11:41 AM End time: 05/19/2014 11:45 AM  Staffing Anesthesiologist: Lyn Hollingshead  Preanesthetic Checklist Completed: patient identified, surgical consent, pre-op evaluation, timeout performed, IV checked, risks and benefits discussed and monitors and equipment checked  Epidural Patient position: sitting Prep: site prepped and draped and DuraPrep Patient monitoring: continuous pulse ox and blood pressure Approach: midline Location: L3-L4 Injection technique: LOR air  Needle:  Needle type: Tuohy  Needle gauge: 17 G Needle length: 9 cm and 9 Needle insertion depth: 5 cm cm Catheter type: closed end flexible Catheter size: 19 Gauge Catheter at skin depth: 10 cm Test dose: negative and Other  Assessment Sensory level: T9 Events: blood not aspirated, injection not painful, no injection resistance, negative IV test and no paresthesia

## 2014-05-20 NOTE — Anesthesia Postprocedure Evaluation (Signed)
Anesthesia Post Note  Patient: Michelle Knapp  Procedure(s) Performed: * No procedures listed *  Anesthesia type: Epidural  Patient location: Mother/Baby  Post pain: Pain level controlled  Post assessment: Post-op Vital signs reviewed  Last Vitals:  Filed Vitals:   05/20/14 0540  BP: 108/56  Pulse: 70  Temp: 36.6 C  Resp: 18    Post vital signs: Reviewed  Level of consciousness:alert  Complications: No apparent anesthesia complications

## 2014-05-20 NOTE — Discharge Summary (Signed)
Obstetric Discharge Summary Reason for Admission: onset of labor Prenatal Procedures: none Intrapartum Procedures: spontaneous vaginal delivery Postpartum Procedures: none Complications-Operative and Postpartum: 2nd degree perineal laceration  Delivery Note At 1:22 PM a healthy female was delivered via Vaginal, Spontaneous Delivery (Presentation: Left Occiput Anterior).  APGAR: 8, 9; weight 9 lb 9.6 oz (4355 g).   Placenta status: Intact, Spontaneous.  Cord: 3 vessels with the following complications: None.    Anesthesia: None  Episiotomy: None Lacerations: 2nd degree Suture Repair: 3.0 vicryl rapide Est. Blood Loss (mL): 450  Mom to postpartum.  Baby to Couplet care / Skin to Skin.  Hospital Course:  Active Problems:   Active labor at term   Michelle Knapp is a 24 y.o. [id number] s/p SVD.  Patient was admitted to L&D.  She has postpartum course that was uncomplicated including no problems with ambulating, PO intake, urination, pain, or bleeding. The pt feels ready to go home and  will be discharged with outpatient follow-up.   Today: No acute events overnight.  Pt denies problems with ambulating, voiding or po intake.  She denies nausea or vomiting.  Pain is well controlled.  She has had flatus. She has had bowel movement.  Lochia Minimal.  Plan for birth control is  oral progesterone-only contraceptive.  Method of Feeding: Breast   H/H: Lab Results  Component Value Date/Time   HGB 15.0 05/19/2014 10:30 AM   HCT 44.1 05/19/2014 10:30 AM    Discharge Diagnoses: Term Pregnancy-delivered  Discharge Information: Date: 05/20/2014 Activity: pelvic rest Diet: routine  Medications: Ibuprofen Breast feeding:  Yes Condition: stable Instructions: refer to handout Discharge to: home   Follow-up Information    Follow up with [facility]. Schedule an appointment as soon as possible for a visit in 4 weeks.   Why:  For your postpartum appointment.   Contact information:   [place] [id number] [phone]       Medication List    TAKE these medications        fluconazole 150 MG tablet  Commonly known as:  DIFLUCAN  TAKE 1 TABLET NOW THEN TAKE 1 TABLET IN 3 DAYS IF NEEDED     ibuprofen 600 MG tablet  Commonly known as:  ADVIL,MOTRIN  Take 1 tablet (600 mg total) by mouth every 6 (six) hours as needed.     multivitamin-prenatal 27-0.8 MG Tabs tablet  Take 1 tablet by mouth daily at 12 noon.     omeprazole 20 MG capsule  Commonly known as:  PRILOSEC  Take 1 capsule (20 mg total) by mouth daily. 1 tablet a day         Lorna Few ,DO Family Medicine PGY-1  05/20/2014,7:31 AM   I have seen and examined this patient and I agree with the above. Serita Grammes CNM 9:11 AM 05/20/2014

## 2014-05-21 ENCOUNTER — Ambulatory Visit: Payer: Self-pay

## 2014-05-21 NOTE — Lactation Note (Signed)
This note was copied from the chart of Michelle Annelies Coyt. Lactation Consultation Note  Patient Name: Michelle Knapp WIOXB'D Date: 05/21/2014  Michelle Knapp was finishing this feeding when [facility] arrived. Mom's nipple round when baby came off the breast. Mom reports some mild tenderness with initial latch that improves as the baby nurses. No breakdown noted. Care for sore nipples reviewed. Comfort gels given with instructions. Reviewed positioning and obtaining good depth with latch. Engorgement care reviewed if needed. Advised of OP services and support group. Mom has history of LMS after 6 weeks with 1st baby. Plans to start Fenugreek, hand out given with instructions.    Maternal Data    Feeding Feeding Type: Breast Fed Length of feed: 15 min  LATCH Score/Interventions Latch: Grasps breast easily, tongue down, lips flanged, rhythmical sucking.  Audible Swallowing: Spontaneous and intermittent  Type of Nipple: Everted at rest and after stimulation  Comfort (Breast/Nipple): Filling, red/small blisters or bruises, mild/mod discomfort  Problem noted: Mild/Moderate discomfort;Cracked, bleeding, blisters, bruises  Hold (Positioning): No assistance needed to correctly position infant at breast.  LATCH Score: 9  Lactation Tools Discussed/Used     Consult Status      Katrine Coho 05/21/2014, 10:20 AM

## 2014-05-24 ENCOUNTER — Inpatient Hospital Stay (HOSPITAL_COMMUNITY): Admission: RE | Admit: 2014-05-24 | Payer: BC Managed Care – PPO | Source: Ambulatory Visit

## 2014-06-28 ENCOUNTER — Encounter: Payer: Self-pay | Admitting: Women's Health

## 2014-06-28 ENCOUNTER — Ambulatory Visit (INDEPENDENT_AMBULATORY_CARE_PROVIDER_SITE_OTHER): Payer: BC Managed Care – PPO | Admitting: Women's Health

## 2014-06-28 VITALS — BP 118/58 | HR 68 | Wt 226.0 lb

## 2014-06-28 DIAGNOSIS — Z3202 Encounter for pregnancy test, result negative: Secondary | ICD-10-CM

## 2014-06-28 NOTE — Progress Notes (Signed)
Patient ID: Michelle Knapp, female   DOB: 03/30/1990, 24 y.o.   MRN: [id number] Subjective:    Michelle Knapp is a 24 y.o. G27P2002 Caucasian female who presents for a postpartum visit. She is 6 weeks postpartum following a spontaneous vaginal delivery at 40 gestational weeks. Anesthesia: none. I have fully reviewed the prenatal and intrapartum course. Postpartum course has been uncomplicated. Baby's course has been uncomplicated. Baby is feeding by breast. Bleeding no bleeding. Bowel function is normal. Bladder function is normal. Patient is sexually active. Last sexual activity: 3/31. Contraception method is condoms and wants iud. Postpartum depression screening: negative. Score 3.  Last pap 10/21/13 and was neg.  The following portions of the patient's history were reviewed and updated as appropriate: allergies, current medications, past medical history, past surgical history and problem list.  Review of Systems Pertinent items are noted in HPI.   Filed Vitals:   06/28/14 1629  BP: 118/58  Pulse: 68  Weight: 226 lb (102.513 kg)   No LMP recorded.  Objective:   General:  alert, cooperative and no distress   Breasts:  deferred, no complaints  Lungs: clear to auscultation bilaterally  Heart:  regular rate and rhythm  Abdomen: soft, nontender   Vulva: normal  Vagina: normal vagina  Cervix:  closed  Corpus: Well-involuted  Adnexa:  Non-palpable  Rectal Exam: no hemorrhoids        Assessment:   Postpartum exam 6 wks s/p SVB Breastfeeding Depression screening Contraception counseling   Plan:   Contraception: abstinence until IUD Follow up in: 4/11 am for bhcg, then pm for iud insertion   Tawnya Crook CNM, [facility] 06/28/2014 5:06 PM

## 2014-07-05 ENCOUNTER — Encounter: Payer: Self-pay | Admitting: Women's Health

## 2014-07-05 ENCOUNTER — Ambulatory Visit (INDEPENDENT_AMBULATORY_CARE_PROVIDER_SITE_OTHER): Payer: BC Managed Care – PPO | Admitting: Women's Health

## 2014-07-05 ENCOUNTER — Other Ambulatory Visit: Payer: BC Managed Care – PPO

## 2014-07-05 VITALS — BP 122/68 | HR 60 | Wt 228.0 lb

## 2014-07-05 DIAGNOSIS — Z30014 Encounter for initial prescription of intrauterine contraceptive device: Secondary | ICD-10-CM

## 2014-07-05 DIAGNOSIS — Z30430 Encounter for insertion of intrauterine contraceptive device: Secondary | ICD-10-CM | POA: Diagnosis not present

## 2014-07-05 DIAGNOSIS — Z975 Presence of (intrauterine) contraceptive device: Secondary | ICD-10-CM | POA: Insufficient documentation

## 2014-07-05 LAB — BETA HCG QUANT (REF LAB): hCG Quant: <1 m[IU]/mL

## 2014-07-05 NOTE — Patient Instructions (Signed)
Nothing in vagina for 3 days (no sex, douching, tampons, etc...)  Check your strings once a month to make sure you can feel them, if you are not able to please let us know  If you develop a fever of 100.4 or more in the next few weeks, or if you develop severe abdominal pain, please let [place] know  Use a backup method of birth control, such as condoms, for 2 weeks   Intrauterine Device Insertion, Care After Refer to this sheet in the next few weeks. These instructions provide you with information on caring for yourself after your procedure. Your health care provider may also give you more specific instructions. Your treatment has been planned according to current medical practices, but problems sometimes occur. Call your health care provider if you have any problems or questions after your procedure. WHAT TO EXPECT AFTER THE PROCEDURE Insertion of the IUD may cause some discomfort, such as cramping. The cramping should improve after the IUD is in place. You may have bleeding after the procedure. This is normal. It varies from light spotting for a few days to menstrual-like bleeding. When the IUD is in place, a string will extend past the cervix into the vagina for 1-2 inches. The strings should not bother you or your partner. If they do, talk to your health care provider.  HOME CARE INSTRUCTIONS   Check your intrauterine device (IUD) to make sure it is in place before you resume sexual activity. You should be able to feel the strings. If you cannot feel the strings, something may be wrong. The IUD may have fallen out of the uterus, or the uterus may have been punctured (perforated) during placement. Also, if the strings are getting longer, it may mean that the IUD is being forced out of the uterus. You no longer have full protection from pregnancy if any of these problems occur.  You may resume sexual intercourse if you are not having problems with the IUD. The copper IUD is considered immediately  effective, and the hormone IUD works right away if inserted within 7 days of your period starting. You will need to use a backup method of birth control for 7 days if the IUD in inserted at any other time in your cycle.  Continue to check that the IUD is still in place by feeling for the strings after every menstrual period.  You may need to take pain medicine such as acetaminophen or ibuprofen. Only take medicines as directed by your health care provider. SEEK MEDICAL CARE IF:   You have bleeding that is heavier or lasts longer than a normal menstrual cycle.  You have a fever.  You have increasing cramps or abdominal pain not relieved with medicine.  You have abdominal pain that does not seem to be related to the same area of earlier cramping and pain.  You are lightheaded, unusually weak, or faint.  You have abnormal vaginal discharge or smells.  You have pain during sexual intercourse.  You cannot feel the IUD strings, or the IUD string has gotten longer.  You feel the IUD at the opening of the cervix in the vagina.  You think you are pregnant, or you miss your menstrual period.  The IUD string is hurting your sex partner. MAKE SURE YOU:  Understand these instructions.  Will watch your condition.  Will get help right away if you are not doing well or get worse. Document Released: 11/08/2010 Document Revised: 12/31/2012 Document Reviewed: 08/31/2012 ExitCare  Patient Information 2015 [employer]. This information is not intended to replace advice given to you by your health care provider. Make sure you discuss any questions you have with your health care provider.  

## 2014-07-05 NOTE — Progress Notes (Signed)
Patient ID: Graviela Nodal, female   DOB: 06/27/1990, 24 y.o.   MRN: [id number] Lexani Corona is a 24 y.o. year old G44P2002 Caucasian female who presents for placement of a Liletta IUD.  No LMP recorded. BP 122/68 mmHg  Pulse 60  Wt 228 lb (103.42 kg)  Breastfeeding? Yes Last sexual intercourse was 3/31, and beta hcg pregnancy test today was neg  The risks and benefits of the method and placement have been thouroughly reviewed with the patient and all questions were answered.  Specifically the patient is aware of failure rate of 03/998, expulsion of the IUD and of possible perforation.  The patient is aware of irregular bleeding due to the method and understands the incidence of irregular bleeding diminishes with time.  Signed copy of informed consent in chart.  She understands the [place] is currently marketed for 3 years, but may be approved for 5 year and possibly 7 year use during the lifetime of her Liletta.  Time out was performed.  A graves speculum was placed in the vagina.  The cervix was visualized, prepped using Betadine, and grasped with a single tooth tenaculum. The uterus was found to be anteroflexed and it sounded to 8 cm.  Liletta IUD placed per manufacturer's recommendations.   The strings were trimmed to 3 cm.  Sonogram was performed and the proper placement of the IUD was verified via transvaginal u/s.   The patient was given post procedure instructions, including signs and symptoms of infection and to check for the strings after each menses or each month, and refraining from intercourse or anything in the vagina for 3 days.  She was given a [place] care card with date Liletta placed, and date Liletta to be removed.  She is scheduled for a f/u appointment in 4 weeks.  Tawnya Crook CNM, [facility] 07/05/2014 4:45 PM

## 2014-08-02 ENCOUNTER — Encounter: Payer: Self-pay | Admitting: Adult Health

## 2014-08-02 ENCOUNTER — Ambulatory Visit (INDEPENDENT_AMBULATORY_CARE_PROVIDER_SITE_OTHER): Payer: BC Managed Care – PPO | Admitting: Adult Health

## 2014-08-02 VITALS — BP 100/62 | HR 60 | Ht 69.0 in | Wt 223.0 lb

## 2014-08-02 DIAGNOSIS — Z975 Presence of (intrauterine) contraceptive device: Secondary | ICD-10-CM | POA: Diagnosis not present

## 2014-08-02 NOTE — Patient Instructions (Signed)
Pap and physical in 09/2015  Follow up prn

## 2014-08-02 NOTE — Progress Notes (Signed)
Subjective:     Patient ID: Michelle Knapp, female   DOB: 1990-12-23, 24 y.o.   MRN: [id number]  HPI Michelle Knapp is a 24 year old white female, married, in for IUD check, she can't feel strings and has spotted some since getting it inserted.  Review of Systems Patient denies any headaches, hearing loss, fatigue, blurred vision, shortness of breath, chest pain, abdominal pain, problems with bowel movements, urination, or intercourse. No joint pain or mood swings. Reviewed past medical,surgical, social and family history. Reviewed medications and allergies.     Objective:   Physical Exam BP 100/62 mmHg  Pulse 60  Ht 5\' 9"  (1.753 m)  Wt 223 lb (101.152 kg)  BMI 32.92 kg/m2  Breastfeeding? Yes    Skin warm and dry.Pelvic: external genitalia is normal in appearance no lesions, vagina:scant discharge without odor,urethra has no lesions or masses noted, cervix is bulbous, +IUD strings, uterus: normal size, shape and contour, non tender, no masses felt, adnexa: no masses or tenderness noted. Bladder is non tender and no masses felt.   Assessment:     IUD in place    Plan:    Follow up prn  Pap and physical in 09/2015

## 2014-10-21 ENCOUNTER — Telehealth: Payer: Self-pay | Admitting: Adult Health

## 2014-10-21 NOTE — Telephone Encounter (Signed)
Pt states want does she need to do to stop production of breast milk. Pt informed to use cold cabbage leafs and tight supportive bra. Pt verbalized understanding.

## 2014-10-30 IMAGING — RF DG CHOLANGIOGRAM OPERATIVE
1 series · 11 of 11 positions shown · IV contrast (agent unspecified)
Comparison: Nuclear medicine hepatobiliary scan 10/22/2012.

CLINICAL DATA: 22-year-old female undergoing cholecystectomy.
Intraoperative cholangiogram.

EXAM:
Intraoperative cholangiogram
TECHNIQUE: Multiple intraoperative fluoroscopic views of the right upper
quadrant.
CONTRAST:  See surgical report.

[Series 1: run · 3 acquisitions, 11 frames shown]
[im 1/3]
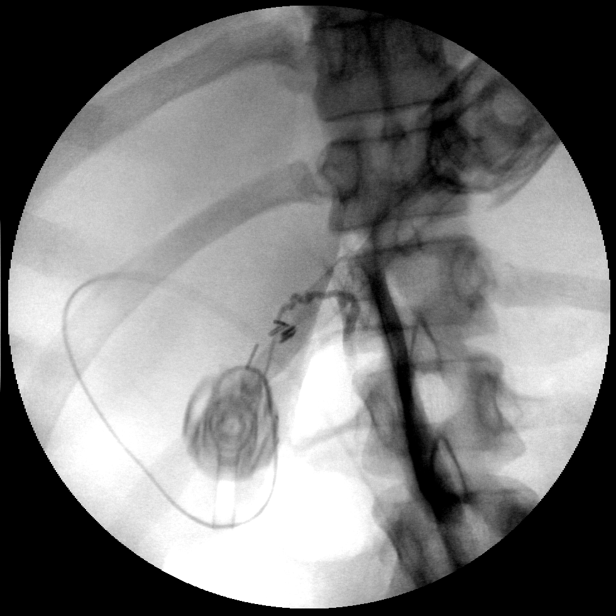
[im 1/3]
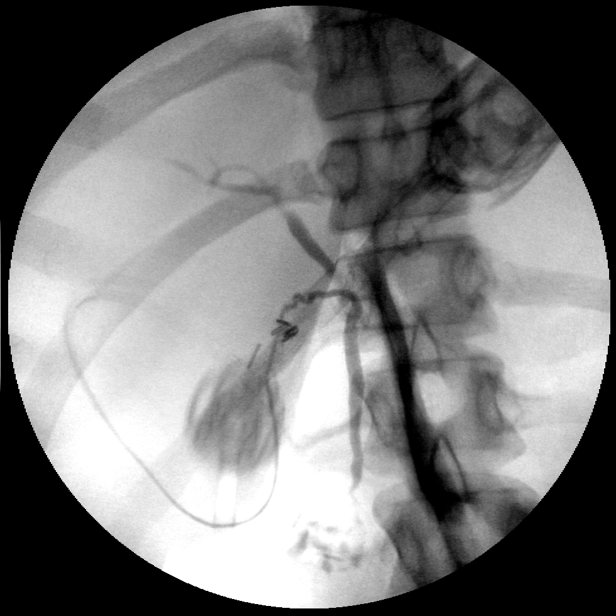
[im 1/3]
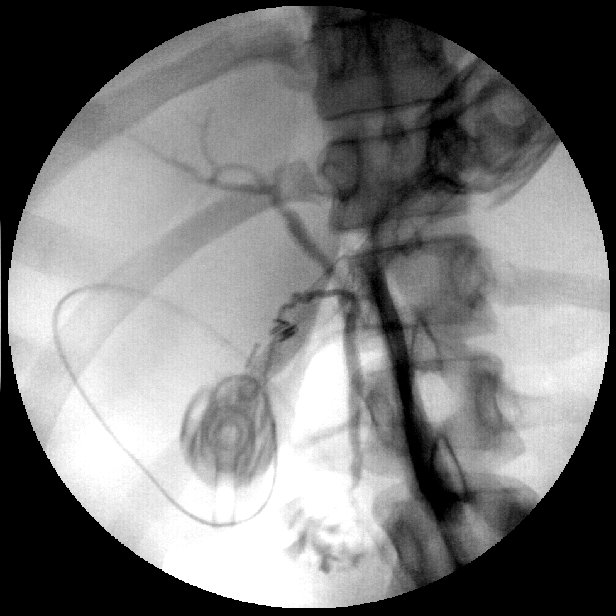
[im 1/3]
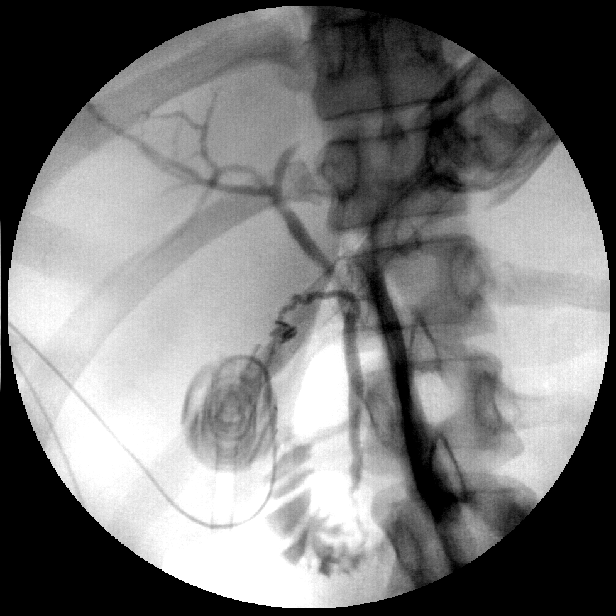
[im 2/3]
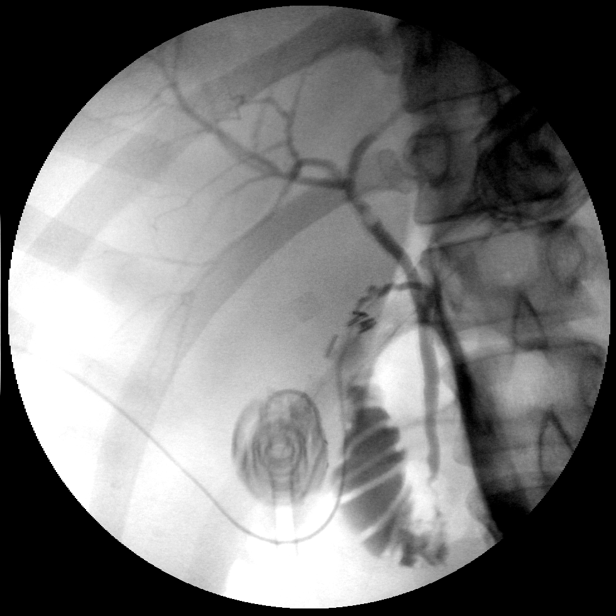
[im 2/3]
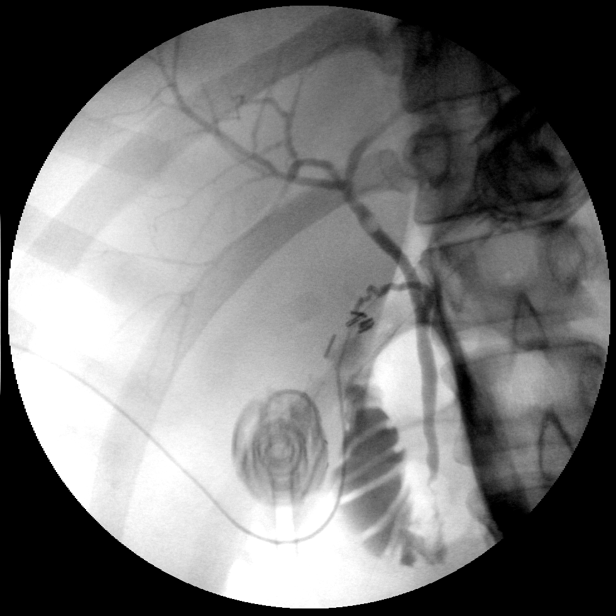
[im 2/3]
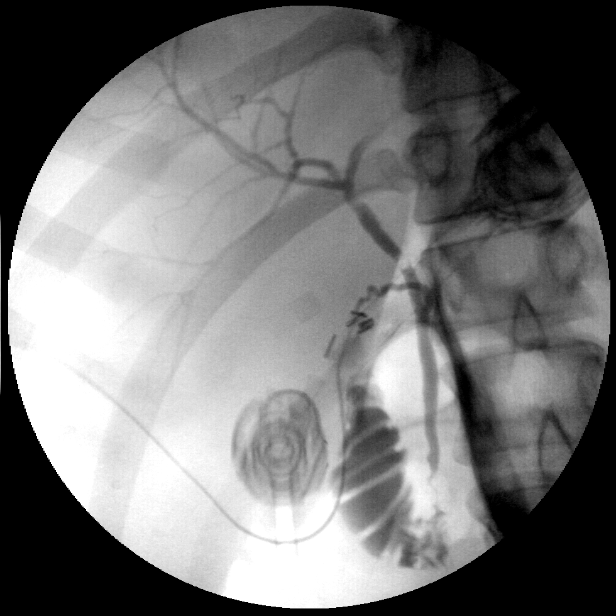
[im 2/3]
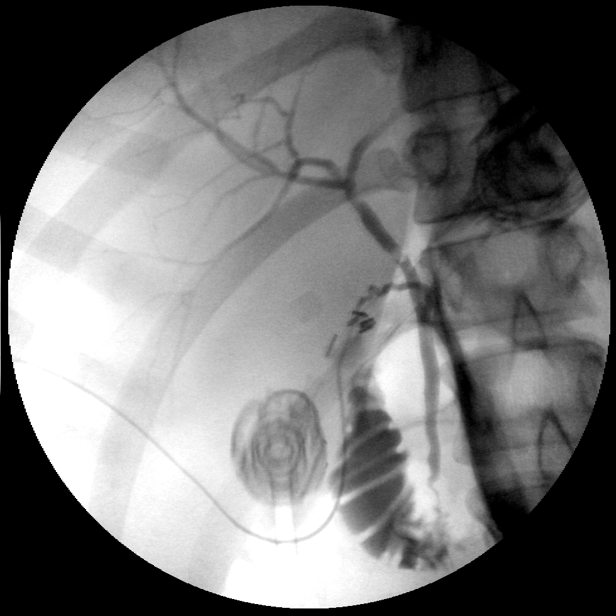
[im 3/3]
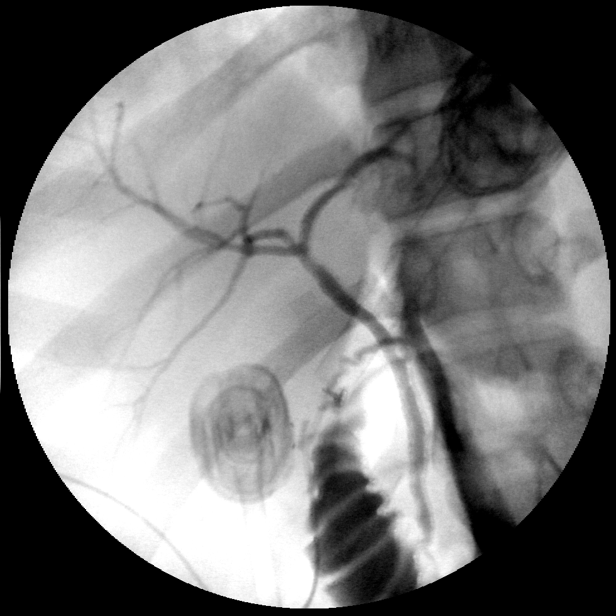
[im 3/3]
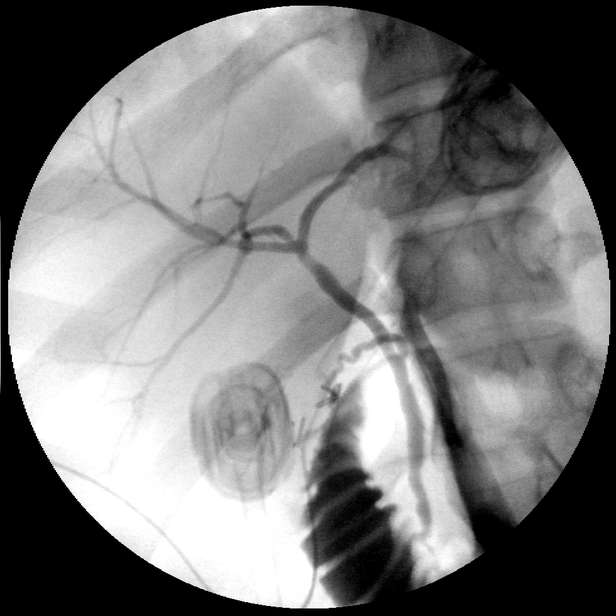
[im 3/3]
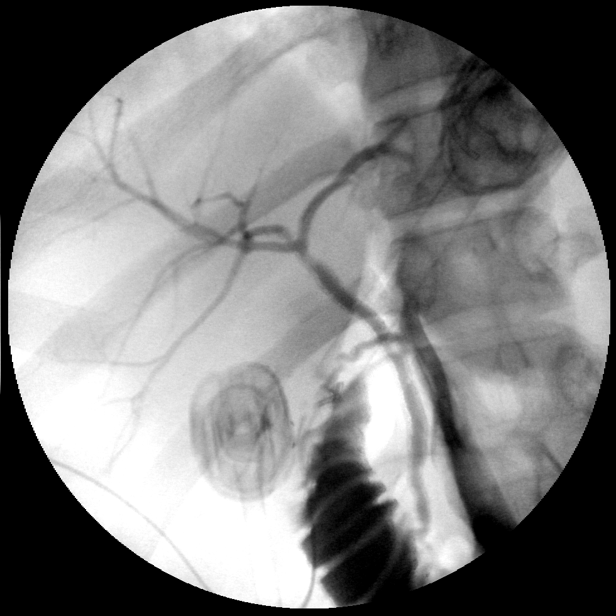

[11 of 11 positions shown; findings below may reference images not displayed]

Abdominal ultrasound 10/21/2012.

RADIOPHARMACEUTICALS:  None.

FLUOROSCOPY TIME:  0 min and 39 seconds.
FINDINGS: Surgical clips at the level of the cystic duct which is cannulated.
Contrast injection demonstrates nondilated intra and extrahepatic
biliary tree with prompt emptying of contrast to the duodenum. Most
apparent on the 2nd fluoroscopic 1, there is a small mobile filling
defect within the CBD proximal to the confluence with the cystic
duct. This is difficult to visualize on the other 2 sequences, on
the 3rd were on I favor 8 is near the confluence of the hepatic
ducts.

No extravasation.
IMPRESSION: Small mobile filling defect within the CBD, favor gas bubble.
Otherwise negative intraoperative cholangiogram.

Discussed by telephone at the time of interpretation on 11/25/2012 at
[DATE] with Dr. Janus Miller Praegel.

## 2015-02-15 ENCOUNTER — Encounter: Payer: Self-pay | Admitting: Adult Health

## 2015-02-15 ENCOUNTER — Ambulatory Visit (INDEPENDENT_AMBULATORY_CARE_PROVIDER_SITE_OTHER): Payer: BC Managed Care – PPO | Admitting: Adult Health

## 2015-02-15 VITALS — BP 108/70 | HR 80 | Ht 69.0 in | Wt 222.5 lb

## 2015-02-15 DIAGNOSIS — F419 Anxiety disorder, unspecified: Secondary | ICD-10-CM | POA: Insufficient documentation

## 2015-02-15 DIAGNOSIS — N921 Excessive and frequent menstruation with irregular cycle: Secondary | ICD-10-CM

## 2015-02-15 DIAGNOSIS — F329 Major depressive disorder, single episode, unspecified: Secondary | ICD-10-CM

## 2015-02-15 DIAGNOSIS — F32A Depression, unspecified: Secondary | ICD-10-CM | POA: Insufficient documentation

## 2015-02-15 DIAGNOSIS — Z975 Presence of (intrauterine) contraceptive device: Secondary | ICD-10-CM

## 2015-02-15 HISTORY — DX: Depression, unspecified: F32.A

## 2015-02-15 HISTORY — DX: Excessive and frequent menstruation with irregular cycle: N92.1

## 2015-02-15 HISTORY — DX: Anxiety disorder, unspecified: F41.9

## 2015-02-15 MED ORDER — SERTRALINE HCL 50 MG PO TABS: 50.0000 mg | ORAL_TABLET | Freq: Every day | ORAL | Status: DC

## 2015-02-15 MED ORDER — MEGESTROL ACETATE 40 MG PO TABS: 40.0000 mg | ORAL_TABLET | Freq: Every day | ORAL | Status: DC

## 2015-02-15 NOTE — Progress Notes (Signed)
Subjective:     Patient ID: Michelle Knapp, female   DOB: Apr 20, 1990, 24 y.o.   MRN: [id number]  HPI Michelle Knapp is a 24 year old white female,married,in complaining of irregular bleeding with IUD,has had it since April.The bleeding has been on and off since October. She has also had some OCD tendencies and worries about kids and feels anxious,doesn't think she is depressed.  Review of Systems Patient denies any headaches, hearing loss, fatigue, blurred vision, shortness of breath, chest pain, abdominal pain, problems with bowel movements, urination, or intercourse. No joint pain or mood swings.See HPI for positives. Reviewed past medical,surgical, social and family history. Reviewed medications and allergies.     Objective:   Physical Exam BP 108/70 mmHg  Pulse 80  Ht 5\' 9"  (1.753 m)  Wt 222 lb 8 oz (100.925 kg)  BMI 32.84 kg/m2  Breastfeeding? No Skin warm and dry.Pelvic: external genitalia is normal in appearance no lesions, vagina: brown period like blood without odor,urethra has no lesions or masses noted, cervix is bulbous,+IUD strings,-CMT. uterus: normal size, shape and contour, non tender, no masses felt, adnexa: no masses or tenderness noted. Bladder is non tender and no masses felt.    PHQ-9 score 5.Discussed could take 2-4 weeks of zoloft to see difference.  Assessment:     Irregular bleeding IUD in place Anxiety Depression,mild    Plan:     Rx megace 40 mg #30 take 1 every daily with 3 refills,can stop if bleeding stops and restart if bleeding returns Rx zoloft 50 mg #30 take 1 daily with 3 refills,but for first 3-4 days just take 1/2 tab Follow up in 6 weeks Review handout on anxiety

## 2015-02-15 NOTE — Patient Instructions (Addendum)
Take 1/2 zoloft 2-3 days then 1 daly Take megace 1 daily Follow up in 6 weeks Generalized Anxiety Disorder Generalized anxiety disorder (GAD) is a mental disorder. It interferes with life functions, including relationships, work, and school. GAD is different from normal anxiety, which everyone experiences at some point in their lives in response to specific life events and activities. Normal anxiety actually helps [place] prepare for and get through these life events and activities. Normal anxiety goes away after the event or activity is over.  GAD causes anxiety that is not necessarily related to specific events or activities. It also causes excess anxiety in proportion to specific events or activities. The anxiety associated with GAD is also difficult to control. GAD can vary from mild to severe. People with severe GAD can have intense waves of anxiety with physical symptoms (panic attacks).  SYMPTOMS The anxiety and worry associated with GAD are difficult to control. This anxiety and worry are related to many life events and activities and also occur more days than not for 6 months or longer. People with GAD also have three or more of the following symptoms (one or more in children):  Restlessness.   Fatigue.  Difficulty concentrating.   Irritability.  Muscle tension.  Difficulty sleeping or unsatisfying sleep. DIAGNOSIS GAD is diagnosed through an assessment by your health care provider. Your health care provider will ask you questions aboutyour mood,physical symptoms, and events in your life. Your health care provider may ask you about your medical history and use of alcohol or drugs, including prescription medicines. Your health care provider may also do a physical exam and blood tests. Certain medical conditions and the use of certain substances can cause symptoms similar to those associated with GAD. Your health care provider may refer you to a mental health specialist for further  evaluation. TREATMENT The following therapies are usually used to treat GAD:   Medication. Antidepressant medication usually is prescribed for long-term daily control. Antianxiety medicines may be added in severe cases, especially when panic attacks occur.   Talk therapy (psychotherapy). Certain types of talk therapy can be helpful in treating GAD by providing support, education, and guidance. A form of talk therapy called cognitive behavioral therapy can teach you healthy ways to think about and react to daily life events and activities.  Stress managementtechniques. These include yoga, meditation, and exercise and can be very helpful when they are practiced regularly. A mental health specialist can help determine which treatment is best for you. Some people see improvement with one therapy. However, other people require a combination of therapies.   This information is not intended to replace advice given to you by your health care provider. Make sure you discuss any questions you have with your health care provider.   Document Released: 07/07/2012 Document Revised: 04/02/2014 Document Reviewed: 07/07/2012 Elsevier Interactive Patient Education [employer].

## 2015-02-23 ENCOUNTER — Telehealth: Payer: Self-pay | Admitting: *Deleted

## 2015-02-23 NOTE — Telephone Encounter (Signed)
Spoke with pt. Pt is bleeding a lot. On Megace 1 daily x 1 week. Bleeding lightens up but don't go away. Please advise. Thanks!! [place]

## 2015-02-23 NOTE — Telephone Encounter (Signed)
Take 3 x 5 days then 2 x 5 days then 1 daily,and if not working call me

## 2015-03-07 ENCOUNTER — Telehealth: Payer: Self-pay | Admitting: Adult Health

## 2015-03-07 NOTE — Telephone Encounter (Signed)
Spoke with pt. Pt is still bleeding everyday. Pt wonders if she should have IUD removed. Please advise. Thanks!! [place]

## 2015-03-07 NOTE — Telephone Encounter (Signed)
She is having bleeding even on 3 megace, she wants IUD out, appt for tomorrow

## 2015-03-08 ENCOUNTER — Encounter: Payer: Self-pay | Admitting: Adult Health

## 2015-03-08 ENCOUNTER — Ambulatory Visit (INDEPENDENT_AMBULATORY_CARE_PROVIDER_SITE_OTHER): Payer: BC Managed Care – PPO | Admitting: Adult Health

## 2015-03-08 VITALS — BP 110/80 | HR 74 | Ht 69.0 in | Wt 220.5 lb

## 2015-03-08 DIAGNOSIS — Z30011 Encounter for initial prescription of contraceptive pills: Secondary | ICD-10-CM

## 2015-03-08 DIAGNOSIS — Z309 Encounter for contraceptive management, unspecified: Secondary | ICD-10-CM

## 2015-03-08 DIAGNOSIS — Z3202 Encounter for pregnancy test, result negative: Secondary | ICD-10-CM | POA: Diagnosis not present

## 2015-03-08 DIAGNOSIS — Z30432 Encounter for removal of intrauterine contraceptive device: Secondary | ICD-10-CM

## 2015-03-08 HISTORY — DX: Encounter for removal of intrauterine contraceptive device: Z30.432

## 2015-03-08 HISTORY — DX: Encounter for contraceptive management, unspecified: Z30.9

## 2015-03-08 HISTORY — PX: IUD REMOVAL: SHX5392

## 2015-03-08 LAB — POCT URINE PREGNANCY: Preg Test, Ur: NEGATIVE

## 2015-03-08 MED ORDER — NORGESTIMATE-ETH ESTRADIOL 0.25-35 MG-MCG PO TABS: 1.0000 | ORAL_TABLET | Freq: Every day | ORAL | Status: DC

## 2015-03-08 NOTE — Progress Notes (Signed)
Subjective:     Patient ID: Michelle Knapp, female   DOB: [birth year], 24 y.o.   MRN: [id number]  HPI Michelle Knapp is a 24 year old white female, married in for IUD removal, has had bleeding even with megace.She wants IUD removed and wants to start OCs.  Review of Systems Patient denies any headaches, hearing loss, fatigue, blurred vision, shortness of breath, chest pain, abdominal pain, problems with bowel movements, urination, or intercourse. No joint pain or mood swings.See HPI for positives  Reviewed past medical,surgical, social and family history. Reviewed medications and allergies.     Objective:   Physical Exam BP 110/80 mmHg  Pulse 74  Ht 5\' 9"  (1.753 m)  Wt 220 lb 8 oz (100.018 kg)  BMI 32.55 kg/m2  Breastfeeding? No UPT negative, consent signed, speculum inserted in vaginaand cervix visualized and IUD strings grasped with forceps, pt asked to cough and IUD easily removed, no CMT, no tenderness on bi manuel exam, uterus felt to be NSSC, no adnexal masses or tenderness.    Assessment:     IUD removal Contraceptive management     Plan:    Stop megace Rx sprintec, disp 3 packs take 1 daily with 4 refills,start today and use condoms x 1 pack Follow up in 3 months

## 2015-03-08 NOTE — Patient Instructions (Signed)
Start sprintec today use condoms for 1 month Follow up in 3 months

## 2015-03-10 ENCOUNTER — Telehealth: Payer: Self-pay | Admitting: Adult Health

## 2015-03-10 NOTE — Telephone Encounter (Signed)
Bleeding heavy after clot, passed, take sprintec this is probably your period

## 2015-03-29 ENCOUNTER — Telehealth: Payer: Self-pay | Admitting: Adult Health

## 2015-03-29 NOTE — Telephone Encounter (Signed)
Doing good on zoloft will cancel tomorrow appt, call with need for refills, and has had no more bleeding since on OCs

## 2015-03-30 ENCOUNTER — Ambulatory Visit: Payer: BC Managed Care – PPO | Admitting: Adult Health

## 2015-05-18 ENCOUNTER — Other Ambulatory Visit: Payer: Self-pay | Admitting: *Deleted

## 2015-05-18 MED ORDER — SERTRALINE HCL 50 MG PO TABS: 50.0000 mg | ORAL_TABLET | Freq: Every day | ORAL | Status: DC

## 2015-10-03 ENCOUNTER — Other Ambulatory Visit: Payer: Self-pay | Admitting: Adult Health

## 2015-12-15 ENCOUNTER — Telehealth: Payer: Self-pay | Admitting: Adult Health

## 2015-12-15 NOTE — Telephone Encounter (Signed)
Spoke with pt. Pt states her pharmacy keeps switching companies for her birth control. She feels more emotional and moody and having vaginal irritation. I spoke with JAG and she advised to call pharmacy and request to get what she prescribed. If problems there, call us back tomorrow and she may have to get birth control at a different pharmacy. Pt voiced understanding. [place]

## 2015-12-28 ENCOUNTER — Telehealth: Payer: Self-pay | Admitting: Adult Health

## 2015-12-28 MED ORDER — FLUCONAZOLE 150 MG PO TABS: 150.0000 mg | ORAL_TABLET | Freq: Once | ORAL | 2 refills | Status: AC

## 2015-12-28 NOTE — Telephone Encounter (Signed)
rx sent for diflucan

## 2015-12-28 NOTE — Telephone Encounter (Signed)
Spoke with pt. Pt thinks she has a yeast infection. Vaginal discharge x 1 week. Monistat no help. Pt unable to come to office. Can you order Diflucan? Thanks!! [place]

## 2016-01-25 ENCOUNTER — Other Ambulatory Visit: Payer: Self-pay | Admitting: Adult Health

## 2016-01-31 ENCOUNTER — Telehealth: Payer: Self-pay | Admitting: Adult Health

## 2016-01-31 NOTE — Telephone Encounter (Signed)
She has lost weight and is exercising and wants to stop zoloft, so wean down, take 1/2 tablet for 2 weeks then start skipping days then stop

## 2016-01-31 NOTE — Telephone Encounter (Signed)
Left message I called 

## 2016-02-15 ENCOUNTER — Encounter: Payer: Self-pay | Admitting: Family Medicine

## 2016-02-15 ENCOUNTER — Ambulatory Visit (INDEPENDENT_AMBULATORY_CARE_PROVIDER_SITE_OTHER): Payer: BC Managed Care – PPO | Admitting: Family Medicine

## 2016-02-15 VITALS — BP 114/74 | HR 85 | Temp 98.9°F | Resp 20 | Wt 189.5 lb

## 2016-02-15 DIAGNOSIS — J0100 Acute maxillary sinusitis, unspecified: Secondary | ICD-10-CM

## 2016-02-15 MED ORDER — AMOXICILLIN-POT CLAVULANATE 875-125 MG PO TABS: 1.0000 | ORAL_TABLET | Freq: Two times a day (BID) | ORAL | 0 refills | Status: DC

## 2016-02-15 NOTE — Progress Notes (Signed)
Acie Venables , 1990/04/16, 25 y.o., female MRN: [id number] Patient Care Team    Relationship Specialty Notifications Start End  Tammi Sou, MD PCP - General Family Medicine  10/20/12     CC: URI Subjective: Pt presents for an acute OV with complaints of URI-sx of 2 weeks duration.  Associated symptoms include hoarseness, cough, burning in chest, chest pressure, fatigue, achy, hedache and increased mucous production. She reports she started with symptoms about 2 weeks ago, started to improve, but over the last week worsened again. She Has tried cough medicine and mucinex.  She does not of a h/o asthma or smoking.  No Known Allergies Social History  Substance Use Topics  . Smoking status: Never Smoker  . Smokeless tobacco: Never Used  . Alcohol use No     Comment: occassionally   Past Medical History:  Diagnosis Date  . Anemia   . Anxiety 02/15/2015  . Cholelithiasis with acute or chronic cholecystitis    Cholecystectomy 11/25/12  . Contraceptive management 03/08/2015  . Depression 02/15/2015  . Encounter for IUD removal 03/08/2015  . Irregular intermenstrual bleeding 02/15/2015  . 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 Vaginal discharge 11/11/2013  . Yeast infection 11/11/2013   Past Surgical History:  Procedure Laterality Date  . CHOLECYSTECTOMY N/A 11/25/2012   Procedure: LAPAROSCOPIC CHOLECYSTECTOMY WITH INTRAOPERATIVE CHOLANGIOGRAM;  Surgeon: Madilyn Hook, DO;  Location: [facility];  Service: General;  Laterality: N/A;  . FOOT SURGERY  age [age] yrs   arches lowered on both feet: hx of both feet with chronic pain and episodes of turning blue   Family History  Problem Relation Age of Onset  . Fibromyalgia Mother   . Fibromyalgia Sister   . Diabetes Maternal Grandmother   . Diabetes Paternal Grandmother   . Cancer Paternal Grandfather     lungs     Medication List       Accurate as of 02/15/16  2:13 PM. Always use your most recent med list.          norgestimate-ethinyl estradiol 0.25-35 MG-MCG tablet Commonly known as:  ORTHO-CYCLEN,SPRINTEC,PREVIFEM TAKE ONE (1) TABLET EACH DAY   sertraline 50 MG tablet Commonly known as:  ZOLOFT TAKE ONE (1) TABLET EACH DAY       No results found for this or any previous visit (from the past 24 hour(s)). No results found.   ROS: Negative, with the exception of above mentioned in HPI   Objective:  BP 114/74 (BP Location: Left Arm, Patient Position: Sitting, Cuff Size: Large)   Pulse 85   Temp 98.9 F (37.2 C)   Resp 20   Wt 189 lb 8 oz (86 kg)   SpO2 98%   BMI 27.98 kg/m  Body mass index is 27.98 kg/m. Gen: Afebrile. No acute distress. Nontoxic in appearance, well developed, well nourished. pleasanr caucasian  female.  HENT: AT. [place]. Bilateral TM visualized WNL. MMM, no oral lesions. Bilateral nares with erythema,drainage. Throat without erythema or exudates. PND present, hoarseness and TTP bilateral max sinus. Eyes:Pupils Equal Round Reactive to light, Extraocular movements intact,  Conjunctiva without redness, discharge or icterus. Neck/lymp/endocrine: Supple,no lymphadenopathy CV: RRR  Chest: CTAB, no wheeze or crackles. Good air movement, normal resp effort.  Abd: Soft. NTND. BS present.   Neuro: Normal gait. PERLA. EOMi. Alert. Oriented x3   Assessment/Plan: Adia Mcgarey is a 25 y.o. female present for acute OV for  Acute maxillary sinusitis, recurrence not specified augmentin  BID for 10 days Flonase, mucinex, rest,  fluids.  F/U PRN  electronically signed by:  Howard Pouch, DO  [place]

## 2016-02-15 NOTE — Patient Instructions (Addendum)
Augmentin every 12 hours for 10 days prescribed.  Flonase, mucinex, rest,  fluids.   Have a great Thanksgiving!!!    Sinusitis, Adult Sinusitis is soreness and inflammation of your sinuses. Sinuses are hollow spaces in the bones around your face. They are located:  Around your eyes.  In the middle of your forehead.  Behind your nose.  In your cheekbones. Your sinuses and nasal passages are lined with a stringy fluid (mucus). Mucus normally drains out of your sinuses. When your nasal tissues get inflamed or swollen, the mucus can get trapped or blocked so air cannot flow through your sinuses. This lets bacteria, viruses, and funguses grow, and that leads to infection. Follow these instructions at home: Medicines  Take, use, or apply over-the-counter and prescription medicines only as told by your doctor. These may include nasal sprays.  If you were prescribed an antibiotic medicine, take it as told by your doctor. Do not stop taking the antibiotic even if you start to feel better. Hydrate and Humidify  Drink enough water to keep your pee (urine) clear or pale yellow.  Use a cool mist humidifier to keep the humidity level in your home above 50%.  Breathe in steam for 10-15 minutes, 3-4 times a day or as told by your doctor. You can do this in the bathroom while a hot shower is running.  Try not to spend time in cool or dry air. Rest  Rest as much as possible.  Sleep with your head raised (elevated).  Make sure to get enough sleep each night. General instructions  Put a warm, moist washcloth on your face 3-4 times a day or as told by your doctor. This will help with discomfort.  Wash your hands often with soap and water. If there is no soap and water, use hand sanitizer.  Do not smoke. Avoid being around people who are smoking (secondhand smoke).  Keep all follow-up visits as told by your doctor. This is important. Contact a doctor if:  You have a fever.  Your  symptoms get worse.  Your symptoms do not get better within 10 days. Get help right away if:  You have a very bad headache.  You cannot stop throwing up (vomiting).  You have pain or swelling around your face or eyes.  You have trouble seeing.  You feel confused.  Your neck is stiff.  You have trouble breathing. This information is not intended to replace advice given to you by your health care provider. Make sure you discuss any questions you have with your health care provider. Document Released: 08/29/2007 Document Revised: 11/06/2015 Document Reviewed: 01/05/2015 Elsevier Interactive Patient Education  2017 [employer].

## 2016-05-28 ENCOUNTER — Other Ambulatory Visit: Payer: Self-pay | Admitting: Adult Health

## 2016-07-16 ENCOUNTER — Telehealth: Payer: Self-pay | Admitting: Adult Health

## 2016-07-16 NOTE — Telephone Encounter (Signed)
Left message to take active pills, not the placebos,that week, call  Me if any questions.

## 2016-09-25 ENCOUNTER — Other Ambulatory Visit: Payer: Self-pay | Admitting: Adult Health

## 2016-09-25 ENCOUNTER — Telehealth: Payer: Self-pay | Admitting: Family Medicine

## 2016-09-25 NOTE — Telephone Encounter (Signed)
Patient requesting nausea patch because they are going on a cruise next Tuesday.  Patient advised Dr Anitra Lauth out of the office until Monday.  Please RX to [facility] in [place].

## 2016-09-27 ENCOUNTER — Ambulatory Visit (INDEPENDENT_AMBULATORY_CARE_PROVIDER_SITE_OTHER): Payer: BC Managed Care – PPO | Admitting: Women's Health

## 2016-09-27 ENCOUNTER — Encounter: Payer: Self-pay | Admitting: Women's Health

## 2016-09-27 VITALS — BP 110/70 | HR 74 | Wt 204.0 lb

## 2016-09-27 DIAGNOSIS — R350 Frequency of micturition: Secondary | ICD-10-CM

## 2016-09-27 DIAGNOSIS — N898 Other specified noninflammatory disorders of vagina: Secondary | ICD-10-CM

## 2016-09-27 LAB — POCT WET PREP (WET MOUNT)
Clue Cells Wet Prep Whiff POC: NEGATIVE
Trichomonas Wet Prep HPF POC: ABSENT

## 2016-09-27 LAB — POCT URINALYSIS DIPSTICK
Blood, UA: NEGATIVE
GLUCOSE UA: NEGATIVE
KETONES UA: NEGATIVE
Leukocytes, UA: NEGATIVE
Nitrite, UA: NEGATIVE
Protein, UA: NEGATIVE

## 2016-09-27 NOTE — Progress Notes (Signed)
   [facility] Visit  Patient name: Michelle Knapp MRN [id number]  Date of birth: Sep 06, 1990  CC & HPI:  Michelle Knapp is a 26 y.o. G31P2002 Caucasian female presenting today w/ report of urinary frequency and pressure x 4 days. Has never had a uti. Also clear vaginal d/c, no odor/itching/irritation. Going on a cruise to [place] next Tuesday.  Patient's last menstrual period was 09/07/2016. The current method of family planning is OCP (estrogen/progesterone). Last pap 10/21/13 neg  Pertinent History Reviewed:  Medical & Surgical Hx:   Past medical, surgical, family, and social history reviewed in electronic medical record Medications: Reviewed & Updated - see associated section Allergies: Reviewed in electronic medical record  Objective Findings:  Vitals: BP 110/70   Pulse 74   Wt 204 lb (92.5 kg)   LMP 09/07/2016   BMI 30.13 kg/m  Body mass index is 30.13 kg/m.  Physical Examination: General appearance - alert, well appearing, and in no distress Pelvic - cx clear, small amt white nonodorous d/c, wet prep neg  Results for orders placed or performed in visit on 09/27/16 (from the past 24 hour(s))  POCT urinalysis dipstick   Collection Time: 09/27/16  9:44 AM  Result Value Ref Range   Color, UA     Clarity, UA     Glucose, UA neg    Bilirubin, UA     Ketones, UA neg    Spec Grav, UA  1.010 - 1.025   Blood, UA neg    pH, UA  5.0 - 8.0   Protein, UA neg    Urobilinogen, UA  0.2 or 1.0 E.U./dL   Nitrite, UA neg    Leukocytes, UA Negative Negative  POCT Wet Prep [facility])   Collection Time: 09/27/16 10:09 AM  Result Value Ref Range   Source Wet Prep POC vaginal    WBC, Wet Prep HPF POC few    Bacteria Wet Prep HPF POC None (A) Few   BACTERIA WET PREP MORPHOLOGY POC     Clue Cells Wet Prep HPF POC None None   Clue Cells Wet Prep Whiff POC Negative Whiff    Yeast Wet Prep HPF POC None    KOH Wet Prep POC     Trichomonas Wet Prep HPF POC Absent Absent      Assessment & Plan:  A:   Urinary frequency/pressure w/ neg urine dipstick  Normal vaginal d/c  P:  Gc/ct, urine cx today  Push water  Return in about 3 weeks (around 10/18/2016) for Pap & physical.  Tawnya Crook CNM, [facility] 09/27/2016 10:09 AM

## 2016-09-28 LAB — GC/CHLAMYDIA PROBE AMP
Chlamydia trachomatis, NAA: NEGATIVE
Neisseria gonorrhoeae by PCR: NEGATIVE

## 2016-09-29 LAB — URINE CULTURE

## 2016-09-30 MED ORDER — SCOPOLAMINE 1 MG/3DAYS TD PT72: 1.0000 | MEDICATED_PATCH | TRANSDERMAL | 1 refills | Status: DC

## 2016-09-30 NOTE — Telephone Encounter (Signed)
Scopolamine patches eRx'd as per pt request.

## 2016-10-01 ENCOUNTER — Telehealth: Payer: Self-pay | Admitting: Obstetrics & Gynecology

## 2016-10-01 ENCOUNTER — Ambulatory Visit: Payer: BC Managed Care – PPO | Admitting: Women's Health

## 2016-10-01 ENCOUNTER — Other Ambulatory Visit: Payer: Self-pay | Admitting: Women's Health

## 2016-10-01 MED ORDER — SULFAMETHOXAZOLE-TRIMETHOPRIM 800-160 MG PO TABS: 1.0000 | ORAL_TABLET | Freq: Two times a day (BID) | ORAL | 0 refills | Status: DC

## 2016-10-01 NOTE — Telephone Encounter (Signed)
Patient notified and verbalized understanding. 

## 2016-10-01 NOTE — Telephone Encounter (Signed)
Informed patient that prescription was sent to pharmacy. Verbalized understanding.

## 2016-10-02 ENCOUNTER — Telehealth: Payer: Self-pay | Admitting: *Deleted

## 2016-10-02 MED ORDER — FLUCONAZOLE 150 MG PO TABS: 150.0000 mg | ORAL_TABLET | Freq: Once | ORAL | 0 refills | Status: AC

## 2016-10-02 NOTE — Telephone Encounter (Signed)
Patient called with concerns of developing a yeast infection after taking the antibiotics for a UTI. She is leaving tonight for [place] to go on a cruise and wanted to see if she could get Diflucan just in case it occurs while on the cruise. Please advise.

## 2016-10-02 NOTE — Telephone Encounter (Signed)
Informed prescription was sent for Diflucan.

## 2016-11-12 ENCOUNTER — Other Ambulatory Visit: Payer: BC Managed Care – PPO | Admitting: Women's Health

## 2016-11-20 ENCOUNTER — Other Ambulatory Visit (HOSPITAL_COMMUNITY)
Admission: RE | Admit: 2016-11-20 | Discharge: 2016-11-20 | Disposition: A | Discharge status: Home / Self Care | Payer: BC Managed Care – PPO | Source: Ambulatory Visit | Attending: Obstetrics & Gynecology | Admitting: Obstetrics & Gynecology

## 2016-11-20 ENCOUNTER — Encounter: Payer: Self-pay | Admitting: Adult Health

## 2016-11-20 ENCOUNTER — Ambulatory Visit (INDEPENDENT_AMBULATORY_CARE_PROVIDER_SITE_OTHER): Payer: BC Managed Care – PPO | Admitting: Adult Health

## 2016-11-20 VITALS — BP 120/60 | HR 78 | Ht 69.0 in | Wt 210.0 lb

## 2016-11-20 DIAGNOSIS — Z3041 Encounter for surveillance of contraceptive pills: Secondary | ICD-10-CM

## 2016-11-20 DIAGNOSIS — Z01419 Encounter for gynecological examination (general) (routine) without abnormal findings: Secondary | ICD-10-CM | POA: Insufficient documentation

## 2016-11-20 DIAGNOSIS — F419 Anxiety disorder, unspecified: Secondary | ICD-10-CM

## 2016-11-20 MED ORDER — NORGESTIMATE-ETH ESTRADIOL 0.25-35 MG-MCG PO TABS: ORAL_TABLET | ORAL | 4 refills | Status: DC

## 2016-11-20 MED ORDER — SERTRALINE HCL 25 MG PO TABS: 25.0000 mg | ORAL_TABLET | Freq: Every day | ORAL | 6 refills | Status: DC

## 2016-11-20 MED ORDER — SERTRALINE HCL 25 MG PO TABS: ORAL_TABLET | ORAL | 6 refills | Status: DC

## 2016-11-20 NOTE — Patient Instructions (Signed)
Physical in 1 year Pap in 3 if normal  mammogram at 38

## 2016-11-20 NOTE — Addendum Note (Signed)
Addended by: Diona Fanti A on: 11/20/2016 02:22 PM   Modules accepted: Orders

## 2016-11-20 NOTE — Progress Notes (Signed)
Patient ID: Michelle Knapp, female   DOB: 01/30/1991, 26 y.o.   MRN: [id number] History of Present Illness:  Michelle Knapp is a 26 year old white female, married, G2P2 in for a well woman gyn exam and pap.   Current Medications, Allergies, Past Medical History, Past Surgical History, Family History and Social History were reviewed in [employer] record.     Review of Systems:  Patient denies any headaches, hearing loss, fatigue, blurred vision, shortness of breath, chest pain, abdominal pain, problems with bowel movements, urination, or intercourse. No joint pain or mood swings.She thinks zoloft needs increasing some for anxiety, she is happy with OCs, husband may want one more child, she is not sure and is thinking, to have one more then tubal in future.    Physical Exam:BP 120/60 (BP Location: Left Arm, Patient Position: Sitting, Cuff Size: Small)   Pulse 78   Ht 5\' 9"  (1.753 m)   Wt 210 lb (95.3 kg)   LMP 10/28/2016   BMI 31.01 kg/m  General:  Well developed, well nourished, no acute distress Skin:  Warm and dry Neck:  Midline trachea, normal thyroid, good ROM, no lymphadenopathy Lungs; Clear to auscultation bilaterally Breast:  No dominant palpable mass, retraction, or nipple discharge Cardiovascular: Regular rate and rhythm Abdomen:  Soft, non tender, no hepatosplenomegaly Pelvic:  External genitalia is normal in appearance, no lesions.  The vagina is normal in appearance. Urethra has no lesions or masses. The cervix is bulbous. Pap with reflex HPV performed.  Uterus is felt to be normal size, shape, and contour.  No adnexal masses or tenderness noted.Bladder is non tender, no masses felt. Extremities/musculoskeletal:  No swelling or varicosities noted, no clubbing or cyanosis Psych:  No mood changes, alert and cooperative,seems happy PHQ 2 score 0.  Impression: 1. Encounter for routine gynecological examination with Papanicolaou smear of cervix   2. Anxiety   3.  Encounter for surveillance of contraceptive pills      Plan: Refilled previfem take 1 daily # 3 packs with 4 refills Rx zoloft 25 mg #90 take 3 daily, with 6 refills   Physical in 1 year Pap in 3 if normal  Mammogram at 38  Review handout on tubal sterilization by Tilden Fossa

## 2016-11-22 LAB — CYTOLOGY - PAP: DIAGNOSIS: NEGATIVE

## 2016-12-12 ENCOUNTER — Telehealth: Payer: Self-pay | Admitting: Adult Health

## 2016-12-12 NOTE — Telephone Encounter (Signed)
Left message to decrease to 50 mg and give it several days and let me know

## 2017-06-03 ENCOUNTER — Encounter: Payer: Self-pay | Admitting: Family Medicine

## 2017-06-03 ENCOUNTER — Ambulatory Visit: Payer: BC Managed Care – PPO | Admitting: Family Medicine

## 2017-06-03 VITALS — BP 140/83 | HR 69 | Temp 98.3°F | Resp 16 | Ht 69.0 in | Wt 220.0 lb

## 2017-06-03 DIAGNOSIS — F411 Generalized anxiety disorder: Secondary | ICD-10-CM | POA: Diagnosis not present

## 2017-06-03 DIAGNOSIS — M797 Fibromyalgia: Secondary | ICD-10-CM | POA: Diagnosis not present

## 2017-06-03 MED ORDER — DULOXETINE HCL 30 MG PO CPEP: 30.0000 mg | ORAL_CAPSULE | Freq: Every day | ORAL | 0 refills | Status: DC

## 2017-06-03 NOTE — Progress Notes (Signed)
OFFICE VISIT  06/10/2017   CC:  Chief Complaint  Patient presents with  . Generalized Body Aches   HPI:    Patient is a 27 y.o.  female who presents for "body aches".  (Of note, she presented with this complaint on 10/20/12 at her visit to establish care with me.  Her exam was not suggestive of myositis or arthritis, and lab eval consisting of thyroid studies, CBC, ESR, ANA, CK total, and Rh factor was all normal.)  Has had generalized body aches on/off for 10 yrs or so. Never seems to completely go away, waxes and wanes in intensity.  Bilat traps, up to back of neck and back of head, shoulders, backs of knees, hips.  Never sees redness or swelling of joints. Feels fatigued.  Feels like her sleep is poor, not restorative.  No snoring.  No witnessed apnea in sleep. The symptoms irritate her.  Has lots of chronic worry/anxiety--about little things and big things equally.    Occ tylenol taken for sx's.  Past Medical History:  Diagnosis Date  . Anemia   . Anxiety 02/15/2015  . Cholelithiasis with acute or chronic cholecystitis    Cholecystectomy 11/25/12  . Contraceptive management 03/08/2015  . Depression 02/15/2015  . Encounter for IUD removal 03/08/2015  . Irregular intermenstrual bleeding 02/15/2015  . 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 Vaginal discharge 11/11/2013  . Yeast infection 11/11/2013    Past Surgical History:  Procedure Laterality Date  . CHOLECYSTECTOMY N/A 11/25/2012   Procedure: LAPAROSCOPIC CHOLECYSTECTOMY WITH INTRAOPERATIVE CHOLANGIOGRAM;  Surgeon: Madilyn Hook, DO;  Location: [facility];  Service: General;  Laterality: N/A;  . FOOT SURGERY  age [age] yrs   arches lowered on both feet: hx of both feet with chronic pain and episodes of turning blue   Fam hx: mom, GM, and sister and maternal GM all with fibromyalgia.  Outpatient Medications Prior to Visit  Medication Sig Dispense Refill  . norgestimate-ethinyl estradiol (PREVIFEM) 0.25-35  MG-MCG tablet TAKE ONE (1) TABLET EACH DAY 3 Package 4  . sertraline (ZOLOFT) 25 MG tablet Take 3 daily 90 tablet 6   No facility-administered medications prior to visit.     No Known Allergies  ROS Review of Systems  Constitutional: Negative for fatigue and fever.  HENT: Negative for congestion and sore throat.   Eyes: Negative for visual disturbance.  Respiratory: Negative for cough.   Cardiovascular: Negative for chest pain.  Gastrointestinal: Negative for abdominal pain and nausea.  Genitourinary: Negative for dysuria.  Musculoskeletal: Negative for back pain and joint swelling.  Skin: Negative for rash.  Neurological: Negative for weakness and headaches.  Hematological: Negative for adenopathy.    PE: Blood pressure 140/83, pulse 69, temperature 98.3 F (36.8 C), temperature source Oral, resp. rate 16, height _0  (1.753 m), weight 220 lb (99.8 kg), SpO2 100 %. Gen: Alert, well appearing.  Patient is oriented to person, place, time, and situation. AFFECT: pleasant, lucid thought and speech. KYH:CWCB: no injection, icteris, swelling, or exudate.  EOMI, PERRLA. Mouth: lips without lesion/swelling.  Oral mucosa pink and moist. Oropharynx without erythema, exudate, or swelling.  Neck - No masses or thyromegaly or limitation in range of motion CV: RRR, no m/r/g.   LUNGS: CTA bilat, nonlabored resps, good aeration in all lung fields. ABD: soft, NT/ND EXT: no clubbing, cyanosis, or edema.  Musculoskeletal: no joint swelling, erythema, warmth, or tenderness.  ROM of all joints intact. She has diffuse soft tissue  tenderness to palpation from back of neck, tops of shoulders, upper/mid back, and both upper arms, both hips (lateral aspects), around circumference of both knees, diffusely in LL's.  No edema. No rash.   LABS:  Lab Results  Component Value Date   TSH 0.90 10/20/2012   Lab Results  Component Value Date   WBC 15.5 (H) 05/19/2014   HGB 15.0 05/19/2014   HCT 44.1  05/19/2014   MCV 90.0 05/19/2014   PLT 173 05/19/2014   Lab Results  Component Value Date   CREATININE 0.90 11/21/2012   BUN 14 11/21/2012   NA 141 11/21/2012   K 4.1 11/21/2012   CL 105 11/21/2012   CO2 30 11/21/2012   Lab Results  Component Value Date   ALT 20 12/02/2012   AST 14 12/02/2012   ALKPHOS 56 12/02/2012   BILITOT 0.4 12/02/2012   Lab Results  Component Value Date   ANA NEG 10/20/2012   Lab Results  Component Value Date   ANA NEG 10/20/2012   RF <10 10/20/2012   Lab Results  Component Value Date   ESRSEDRATE 5 10/20/2012     IMPRESSION AND PLAN:  Myofascial pain syndrome--Fibromyalgia syndrome suspected.  Also with signif GAD. [facility]erral to [facility] in [place]. Stop zoloft.  Start cymbalta 56m qd.  Pt states she is also going to try Hemp oil/CBD oil.  Spent 25 min with pt today, with >50% of this time spent in counseling and care coordination regarding the above problems.  An After Visit Summary was printed and given to the patient.  FOLLOW UP: Return in about 4 weeks (around 07/01/2017) for f/u fibro.  Signed:  PCrissie Sickles MD           06/10/2017

## 2017-06-04 ENCOUNTER — Telehealth: Payer: Self-pay | Admitting: Family Medicine

## 2017-06-04 NOTE — Telephone Encounter (Unsigned)
Copied from [place] [id number]. Topic: General - Other >> Jun 04, 2017 10:04 AM Bea Graff, NT wrote: Reason for CRM: [facility] need the demographic page and a copy of the pts insurance card faxed to them for the referral they received. Fax#: [phone]. CB#: [phone]  >> Jun 04, 2017 10:21 AM Nickola Major wrote: Faxed insurance card and demographics sheet  >> Jun 04, 2017 11:57 AM Neva Seat wrote: [facility] still needing the legible insurance card for pt faxed to [phone].   The one sent was not legible. Please refax asap.

## 2017-06-04 NOTE — Telephone Encounter (Signed)
Faxed insurance card

## 2017-06-13 ENCOUNTER — Encounter: Payer: Self-pay | Admitting: Family Medicine

## 2017-07-02 ENCOUNTER — Ambulatory Visit: Payer: BC Managed Care – PPO | Admitting: Family Medicine

## 2017-07-02 ENCOUNTER — Encounter: Payer: Self-pay | Admitting: Family Medicine

## 2017-07-02 VITALS — BP 97/64 | HR 76 | Temp 98.5°F | Resp 16 | Ht 69.0 in | Wt 225.2 lb

## 2017-07-02 DIAGNOSIS — M797 Fibromyalgia: Secondary | ICD-10-CM

## 2017-07-02 MED ORDER — DULOXETINE HCL 60 MG PO CPEP: 60.0000 mg | ORAL_CAPSULE | Freq: Every day | ORAL | 3 refills | Status: DC

## 2017-07-02 NOTE — Progress Notes (Signed)
OFFICE VISIT  07/02/2017   CC:  Chief Complaint  Patient presents with  . Follow-up    fibromyalgia    HPI:    Patient is a 27 y.o. Caucasian female who presents for 1 mo f/u provisional dx of fibromyalgia. Started cymbalta 30mg  qd last visit, plus pt stated she was going to do a trial of CBD oil on her own. I referred her to [facility] in [place] as well. Has appt with them next week.  Says body aches have been improved--slightly, has helped anxiety "tremendously".  Mood is improved. Side effect of night sweats and poor sleep for a couple weeks, then this spont resolved and she is sleeping well now. She did try the CBD oil--started it about 2 wks ago.    Past Medical History:  Diagnosis Date  . Anemia   . Anxiety 02/15/2015  . Cholelithiasis with acute or chronic cholecystitis    Cholecystectomy 11/25/12  . Contraceptive management 03/08/2015  . Depression 02/15/2015  . Encounter for IUD removal 03/08/2015  . Irregular intermenstrual bleeding 02/15/2015  . 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 Vaginal discharge 11/11/2013  . Yeast infection 11/11/2013    Past Surgical History:  Procedure Laterality Date  . CHOLECYSTECTOMY N/A 11/25/2012   Procedure: LAPAROSCOPIC CHOLECYSTECTOMY WITH INTRAOPERATIVE CHOLANGIOGRAM;  Surgeon: Madilyn Hook, DO;  Location: [facility];  Service: General;  Laterality: N/A;  . FOOT SURGERY  age [age] yrs   arches lowered on both feet: hx of both feet with chronic pain and episodes of turning blue    Outpatient Medications Prior to Visit  Medication Sig Dispense Refill  . norgestimate-ethinyl estradiol (PREVIFEM) 0.25-35 MG-MCG tablet TAKE ONE (1) TABLET EACH DAY 3 Package 4  . DULoxetine (CYMBALTA) 30 MG capsule Take 1 capsule (30 mg total) by mouth daily. 30 capsule 0   No facility-administered medications prior to visit.     No Known Allergies  ROS As per HPI  PE: Blood pressure 97/64, pulse 76, temperature 98.5 F  (36.9 C), temperature source Oral, resp. rate 16, height 5\' 9"  (1.753 m), weight 225 lb 4 oz (102.2 kg), last menstrual period 06/16/2017, SpO2 100 %.  Pt examined with Helayne Seminole, CMA, as chaperone.  Gen: Alert, well appearing.  Patient is oriented to person, place, time, and situation. CV: RRR, no m/r/g.   LUNGS: CTA bilat, nonlabored resps, good aeration in all lung fields. DTRs patellar 2+ bilat, ankles 1+ bilat no clonus. TTP over traps, scapulae, bilat greater trochs, both elbows and both knees circumferentially, and both thighs. No LE edema.  No joint swelling or erythema.  LABS:  Lab Results  Component Value Date   TSH 0.90 10/20/2012   Lab Results  Component Value Date   WBC 15.5 (H) 05/19/2014   HGB 15.0 05/19/2014   HCT 44.1 05/19/2014   MCV 90.0 05/19/2014   PLT 173 05/19/2014   Lab Results  Component Value Date   CREATININE 0.90 11/21/2012   BUN 14 11/21/2012   NA 141 11/21/2012   K 4.1 11/21/2012   CL 105 11/21/2012   CO2 30 11/21/2012   Lab Results  Component Value Date   ALT 20 12/02/2012   AST 14 12/02/2012   ALKPHOS 56 12/02/2012   BILITOT 0.4 12/02/2012    IMPRESSION AND PLAN:  Fibromyalgia: improved. Increase cymbalta to 60mg  qd. Therapeutic expectations and side effect profile of medication discussed today.  Patient's questions answered. [facility]. She'll make decision about continuing CBD oil or not.  An After Visit Summary was printed and given to the patient.  FOLLOW UP: Return in about 1 month (around 07/30/2017) for f/u fibro.  Signed:  Crissie Sickles, MD           07/02/2017

## 2017-07-29 ENCOUNTER — Telehealth: Payer: Self-pay | Admitting: Adult Health

## 2017-07-29 MED ORDER — FLUCONAZOLE 150 MG PO TABS: ORAL_TABLET | ORAL | 1 refills | Status: DC

## 2017-07-29 NOTE — Telephone Encounter (Signed)
Pt complains of yeast, will rx diflucan

## 2017-07-29 NOTE — Telephone Encounter (Signed)
Patient states she has a yeast infection, white chunky discharge, vaginal itching.  Has not tried anything over the counter, requesting Diflucan. Please advise.

## 2017-08-01 ENCOUNTER — Ambulatory Visit: Payer: BC Managed Care – PPO | Admitting: Family Medicine

## 2017-08-01 ENCOUNTER — Encounter: Payer: Self-pay | Admitting: Family Medicine

## 2017-08-01 VITALS — BP 104/70 | HR 74 | Temp 98.4°F | Resp 16 | Ht 69.0 in | Wt 224.2 lb

## 2017-08-01 DIAGNOSIS — I7300 Raynaud's syndrome without gangrene: Secondary | ICD-10-CM | POA: Diagnosis not present

## 2017-08-01 DIAGNOSIS — M797 Fibromyalgia: Secondary | ICD-10-CM | POA: Diagnosis not present

## 2017-08-01 DIAGNOSIS — E669 Obesity, unspecified: Secondary | ICD-10-CM

## 2017-08-01 NOTE — Progress Notes (Signed)
OFFICE VISIT  08/01/2017   CC:  Chief Complaint  Patient presents with  . Follow-up    fibromyalgia   HPI:    Patient is a 27 y.o. Caucasian female who presents for 1 mo f/u fibromyalgia. Last visit her fibromyalgia sx's were improved some. Last visit: I increased her cymbalta to 60 mg qd.  She was considering a trial of CBD oil. She was about to go to her first appt with [facility] in [place].  She continues to note improvement, esp with increase to 60mg  dose. She estimates she is 70% improved.  MUCH improved regarding anxiety! Doing CBD oil and is unable to tell whether it is helping or not.  Integrative therapies: helpful but she may not be able to fit this into her busy schedule.  ROS: feet and hands turn purple, occ tingle with this but no aching/pain, no sensation of cold or hot, no stiffness. It does not occur any more often in cold environments vs warm environments. Pt says this has been happening for years.  Past Medical History:  Diagnosis Date  . Anemia   . Anxiety 02/15/2015  . Cholelithiasis with acute or chronic cholecystitis    Cholecystectomy 11/25/12  . Contraceptive management 03/08/2015  . Depression 02/15/2015  . Encounter for IUD removal 03/08/2015  . Irregular intermenstrual bleeding 02/15/2015  . 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 Vaginal discharge 11/11/2013  . Yeast infection 11/11/2013    Past Surgical History:  Procedure Laterality Date  . CHOLECYSTECTOMY N/A 11/25/2012   Procedure: LAPAROSCOPIC CHOLECYSTECTOMY WITH INTRAOPERATIVE CHOLANGIOGRAM;  Surgeon: Madilyn Hook, DO;  Location: [facility];  Service: General;  Laterality: N/A;  . FOOT SURGERY  age [age] yrs   arches lowered on both feet: hx of both feet with chronic pain and episodes of turning blue    Outpatient Medications Prior to Visit  Medication Sig Dispense Refill  . DULoxetine (CYMBALTA) 60 MG capsule Take 1 capsule (60 mg total) by mouth daily. 30 capsule 3   . fluconazole (DIFLUCAN) 150 MG tablet Take 1 now and repeat in 3 days if needed 2 tablet 1  . norgestimate-ethinyl estradiol (PREVIFEM) 0.25-35 MG-MCG tablet TAKE ONE (1) TABLET EACH DAY 3 Package 4   No facility-administered medications prior to visit.     No Known Allergies  ROS As per HPI  PE: Blood pressure 104/70, pulse 74, temperature 98.4 F (36.9 C), temperature source Oral, resp. rate 16, height 5\' 9"  (1.753 m), weight 224 lb 4 oz (101.7 kg), SpO2 100 %. Body mass index is 33.12 kg/m.  Gen: Alert, well appearing.  Patient is oriented to person, place, time, and situation. AFFECT: pleasant, lucid thought and speech. Hands: no pallor or discoloration, no clubbing, normal warmth, no erythema or swelling.  Radial pulses 2+ bilat. Feet: mild violaceous discoloration diffusely, normal temperature, nontender.  DP and PT pulses 2+ bilat.  No swelling.  LABS:    Chemistry      Component Value Date/Time   NA 141 11/21/2012 0910   K 4.1 11/21/2012 0910   CL 105 11/21/2012 0910   CO2 30 11/21/2012 0910   BUN 14 11/21/2012 0910   CREATININE 0.90 11/21/2012 0910      Component Value Date/Time   CALCIUM 9.4 11/21/2012 0910   ALKPHOS 56 12/02/2012 1448   AST 14 12/02/2012 1448   ALT 20 12/02/2012 1448   BILITOT 0.4 12/02/2012 1448       IMPRESSION AND PLAN:  1) Fibromyalgia: very good improvement on cymbalta. We'll continue her on 60 mg qd dosing. She will likely continue with CBD oil. She'll possibly do Integrative therapy sessions some more, but sounds like she may have trouble fitting this into her schedule, plus she feels so good with the improvement on the cymbalta. Her anxiety is MUCH improved on the cymbalta.  2) Raynaud's phenomenon: reassured.  Pt essentially asymptomatic.  An After Visit Summary was printed and given to the patient.  FOLLOW UP: Return in about 6 months (around 02/01/2018) for routine chronic illness f/u (fibromyalgia).  Signed:  Crissie Sickles, MD           08/01/2017

## 2017-10-02 ENCOUNTER — Other Ambulatory Visit: Payer: Self-pay | Admitting: Family Medicine

## 2017-11-27 ENCOUNTER — Other Ambulatory Visit: Payer: Self-pay | Admitting: Adult Health

## 2017-12-20 ENCOUNTER — Other Ambulatory Visit: Payer: Self-pay | Admitting: Adult Health

## 2018-02-06 ENCOUNTER — Other Ambulatory Visit: Payer: Self-pay | Admitting: Family Medicine

## 2018-02-06 NOTE — Telephone Encounter (Signed)
Pt is over due for f/u [facility].  Will send in Rx for #30 w/ 0RF.  Needs to schedule follow up apt with Dr. Anitra Lauth for more refills.   Left message for pt to call back.   Okay for [facility] to advise pt.

## 2018-02-11 NOTE — Telephone Encounter (Signed)
Left message for pt to call back.   Okay for [facility] to advise pt. 

## 2018-02-14 ENCOUNTER — Encounter: Payer: Self-pay | Admitting: *Deleted

## 2018-02-14 NOTE — Telephone Encounter (Signed)
Letter mailed to address in EMR.

## 2018-02-26 ENCOUNTER — Ambulatory Visit: Payer: BC Managed Care – PPO | Admitting: Family Medicine

## 2018-02-26 ENCOUNTER — Encounter: Payer: Self-pay | Admitting: Family Medicine

## 2018-02-26 VITALS — BP 130/74 | HR 69 | Temp 98.5°F | Resp 16 | Ht 69.0 in | Wt 215.2 lb

## 2018-02-26 DIAGNOSIS — Z131 Encounter for screening for diabetes mellitus: Secondary | ICD-10-CM | POA: Diagnosis not present

## 2018-02-26 DIAGNOSIS — M797 Fibromyalgia: Secondary | ICD-10-CM

## 2018-02-26 DIAGNOSIS — Z13220 Encounter for screening for lipoid disorders: Secondary | ICD-10-CM | POA: Diagnosis not present

## 2018-02-26 DIAGNOSIS — R5382 Chronic fatigue, unspecified: Secondary | ICD-10-CM | POA: Diagnosis not present

## 2018-02-26 LAB — COMPREHENSIVE METABOLIC PANEL
ALT: 26 U/L (ref 0–35)
AST: 23 U/L (ref 0–37)
Albumin: 4.3 g/dL (ref 3.5–5.2)
Alkaline Phosphatase: 72 U/L (ref 39–117)
BUN: 18 mg/dL (ref 6–23)
CO2: 31 meq/L (ref 19–32)
CREATININE: 0.71 mg/dL (ref 0.40–1.20)
Calcium: 9.7 mg/dL (ref 8.4–10.5)
Chloride: 103 mEq/L (ref 96–112)
GFR: 102.96 mL/min (ref >60.00)
Glucose, Bld: 82 mg/dL (ref 70–99)
Potassium: 4.5 mEq/L (ref 3.5–5.1)
Sodium: 139 mEq/L (ref 135–145)
TOTAL PROTEIN: 7 g/dL (ref 6.0–8.3)
Total Bilirubin: 0.5 mg/dL (ref 0.2–1.2)

## 2018-02-26 LAB — CBC WITH DIFFERENTIAL/PLATELET
BASOS PCT: 1 % (ref 0.0–3.0)
Basophils Absolute: 0.1 10*3/uL (ref 0.0–0.1)
EOS PCT: 1.6 % (ref 0.0–5.0)
Eosinophils Absolute: 0.1 10*3/uL (ref 0.0–0.7)
HCT: 41.3 % (ref 36.0–46.0)
Hemoglobin: 13.6 g/dL (ref 12.0–15.0)
LYMPHS ABS: 2.6 10*3/uL (ref 0.7–4.0)
Lymphocytes Relative: 39.1 % (ref 12.0–46.0)
MCHC: 32.9 g/dL (ref 30.0–36.0)
MCV: 87.9 fl (ref 78.0–100.0)
MONO ABS: 0.5 10*3/uL (ref 0.1–1.0)
Monocytes Relative: 8 % (ref 3.0–12.0)
NEUTROS ABS: 3.3 10*3/uL (ref 1.4–7.7)
NEUTROS PCT: 50.3 % (ref 43.0–77.0)
PLATELETS: 237 10*3/uL (ref 150.0–400.0)
RBC: 4.7 Mil/uL (ref 3.87–5.11)
RDW: 14.3 % (ref 11.5–15.5)
WBC: 6.5 10*3/uL (ref 4.0–10.5)

## 2018-02-26 LAB — LIPID PANEL
CHOL/HDL RATIO: 3
Cholesterol: 208 mg/dL — ABNORMAL HIGH (ref 0–200)
HDL: 64.8 mg/dL (ref >39.00)
LDL CALC: 122 mg/dL — AB (ref 0–99)
NonHDL: 143.69
Triglycerides: 110 mg/dL (ref 0.0–149.0)
VLDL: 22 mg/dL (ref 0.0–40.0)

## 2018-02-26 LAB — TSH: TSH: 1.41 u[IU]/mL (ref 0.35–4.50)

## 2018-02-26 NOTE — Progress Notes (Signed)
OFFICE VISIT  02/26/2018   CC:  Chief Complaint  Patient presents with  . Follow-up    [facility], pt is fasting.     HPI:    Patient is a 27 y.o. Caucasian female who presents for 6 mo f/u fibromyalgia. Last visit was finding significant improvement on cymbalta 60mg  qd, and was set to start with integrative therapies.  Interim hx: Doing well.  Lots of physical activity lately so some hip soreness bilat. She is happy with current treatment regimen. Anxiety well controlled. She never tried the CBD oil.  She still has chronic low level fatigue.  No joint swelling or rash.  No fevers/chills. She has had 9 lbs of purposeful wt loss since last visit.  Menses: regular.  7d of bleeding: 3 light, 2 heavy, 2 light  Past Medical History:  Diagnosis Date  . Anemia   . Anxiety 02/15/2015  . Cholelithiasis with acute or chronic cholecystitis    Cholecystectomy 11/25/12  . Contraceptive management 03/08/2015  . Depression 02/15/2015  . Encounter for IUD removal 03/08/2015  . Fibromyalgia syndrome   . Irregular intermenstrual bleeding 02/15/2015  . Obesity, Class I, BMI 30-34.9   . 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 Raynaud's phenomenon     Past Surgical History:  Procedure Laterality Date  . CHOLECYSTECTOMY N/A 11/25/2012   Procedure: LAPAROSCOPIC CHOLECYSTECTOMY WITH INTRAOPERATIVE CHOLANGIOGRAM;  Surgeon: Madilyn Hook, DO;  Location: [facility];  Service: General;  Laterality: N/A;  . FOOT SURGERY  age [age] yrs   arches lowered on both feet: hx of both feet with chronic pain and episodes of turning blue    Outpatient Medications Prior to Visit  Medication Sig Dispense Refill  . DULoxetine (CYMBALTA) 60 MG capsule Take 1 capsule (60 mg total) by mouth daily. OFFICE VISIT NEEDED 30 capsule 0  . norgestimate-ethinyl estradiol (PREVIFEM) 0.25-35 MG-MCG tablet TAKE ONE (1) TABLET EACH DAY 84 tablet 3  . fluconazole (DIFLUCAN) 150 MG tablet TAKE 1 TABLET NOW THEN REPEAT  IN 3 DAYS IF NEEDED (Patient not taking: Reported on 02/26/2018) 2 tablet 1   No facility-administered medications prior to visit.     No Known Allergies  ROS As per HPI  PE: Blood pressure 130/74, pulse 69, temperature 98.5 F (36.9 C), temperature source Oral, resp. rate 16, height 5\' 9"  (1.753 m), weight 215 lb 4 oz (97.6 kg), last menstrual period 02/16/2018, SpO2 100 %. Body mass index is 31.79 kg/m.  Gen: Alert, well appearing.  Patient is oriented to person, place, time, and situation. AFFECT: pleasant, lucid thought and speech. No further exam today.  LABS:    Chemistry      Component Value Date/Time   NA 141 11/21/2012 0910   K 4.1 11/21/2012 0910   CL 105 11/21/2012 0910   CO2 30 11/21/2012 0910   BUN 14 11/21/2012 0910   CREATININE 0.90 11/21/2012 0910      Component Value Date/Time   CALCIUM 9.4 11/21/2012 0910   ALKPHOS 56 12/02/2012 1448   AST 14 12/02/2012 1448   ALT 20 12/02/2012 1448   BILITOT 0.4 12/02/2012 1448     No results found for: CHOL, HDL, LDLCALC, LDLDIRECT, TRIG, CHOLHDL Lab Results  Component Value Date   TSH 0.90 10/20/2012   Lab Results  Component Value Date   WBC 15.5 (H) 05/19/2014   HGB 15.0 05/19/2014   HCT 44.1 05/19/2014   MCV 90.0 05/19/2014   PLT 173 05/19/2014  IMPRESSION AND PLAN:  1) Fibromyalgia: The current medical regimen is effective;  continue present plan and medications. Anxiety doing well on cymbalta 60mg  qd as well.  2) Chronic fatigue: stable and likely associated with her fibromyalgia syndrome. Will check CBC and TSH today.  3) Diabetes and hypercholesterolemia screening: she is fasting.  Check CMET and FLP today.  An After Visit Summary was printed and given to the patient.  FOLLOW UP: Return in about 6 months (around 08/28/2018) for annual CPE (fasting).  Signed:  Crissie Sickles, MD           02/26/2018

## 2018-02-27 ENCOUNTER — Encounter: Payer: Self-pay | Admitting: *Deleted

## 2018-03-10 ENCOUNTER — Other Ambulatory Visit: Payer: Self-pay | Admitting: Family Medicine

## 2018-04-30 ENCOUNTER — Ambulatory Visit: Payer: BC Managed Care – PPO | Admitting: Family Medicine

## 2018-04-30 ENCOUNTER — Encounter: Payer: Self-pay | Admitting: Family Medicine

## 2018-04-30 VITALS — BP 124/86 | HR 107 | Temp 99.7°F | Resp 16 | Ht 69.0 in | Wt 223.1 lb

## 2018-04-30 DIAGNOSIS — R52 Pain, unspecified: Secondary | ICD-10-CM

## 2018-04-30 DIAGNOSIS — J101 Influenza due to other identified influenza virus with other respiratory manifestations: Secondary | ICD-10-CM

## 2018-04-30 LAB — POC INFLUENZA A&B (BINAX/QUICKVUE)
INFLUENZA A, POC: POSITIVE — AB
Influenza B, POC: NEGATIVE

## 2018-04-30 MED ORDER — OSELTAMIVIR PHOSPHATE 75 MG PO CAPS: 75.0000 mg | ORAL_CAPSULE | Freq: Two times a day (BID) | ORAL | 0 refills | Status: DC

## 2018-04-30 NOTE — Patient Instructions (Signed)
Rest, hydrate.  + flonase, mucinex (DM if cough) tamiflu prescribed, take until completed.  If cough present it can last up to 6-8 weeks.  F/U 2 weeks of not improved.    Influenza, Adult Influenza is also called "the flu." It is an infection in the lungs, nose, and throat (respiratory tract). It is caused by a virus. The flu causes symptoms that are similar to symptoms of a cold. It also causes a high fever and body aches. The flu spreads easily from person to person (is contagious). Getting a flu shot (influenza vaccination) every year is the best way to prevent the flu. What are the causes? This condition is caused by the influenza virus. You can get the virus by:  Breathing in droplets that are in the air from the cough or sneeze of a person who has the virus.  Touching something that has the virus on it (is contaminated) and then touching your mouth, nose, or eyes. What increases the risk? Certain things may make you more likely to get the flu. These include:  Not washing your hands often.  Having close contact with many people during cold and flu season.  Touching your mouth, eyes, or nose without first washing your hands.  Not getting a flu shot every year. You may have a higher risk for the flu, along with serious problems such as a lung infection (pneumonia), if you:  Are older than 65.  Are pregnant.  Have a weakened disease-fighting system (immune system) because of a disease or taking certain medicines.  Have a long-term (chronic) illness, such as: ? Heart, kidney, or lung disease. ? Diabetes. ? Asthma.  Have a liver disorder.  Are very overweight (morbidly obese).  Have anemia. This is a condition that affects your red blood cells. What are the signs or symptoms? Symptoms usually begin suddenly and last 4-14 days. They may include:  Fever and chills.  Headaches, body aches, or muscle aches.  Sore throat.  Cough.  Runny or stuffy (congested)  nose.  Chest discomfort.  Not wanting to eat as much as normal (poor appetite).  Weakness or feeling tired (fatigue).  Dizziness.  Feeling sick to your stomach (nauseous) or throwing up (vomiting). How is this treated? If the flu is found early, you can be treated with medicine that can help reduce how bad the illness is and how long it lasts (antiviral medicine). This may be given by mouth (orally) or through an IV tube. Taking care of yourself at home can help your symptoms get better. Your doctor may suggest:  Taking over-the-counter medicines.  Drinking plenty of fluids. The flu often goes away on its own. If you have very bad symptoms or other problems, you may be treated in a hospital. Follow these instructions at home:     Activity  Rest as needed. Get plenty of sleep.  Stay home from work or school as told by your doctor. ? Do not leave home until you do not have a fever for 24 hours without taking medicine. ? Leave home only to visit your doctor. Eating and drinking  Take an ORS (oral rehydration solution). This is a drink that is sold at pharmacies and stores.  Drink enough fluid to keep your pee (urine) pale yellow.  Drink clear fluids in small amounts as you are able. Clear fluids include: ? Water. ? Ice chips. ? Fruit juice that has water added (diluted fruit juice). ? Low-calorie sports drinks.  Eat bland, easy-to-digest foods  in small amounts as you are able. These foods include: ? Bananas. ? Applesauce. ? Rice. ? Lean meats. ? Toast. ? Crackers.  Do not eat or drink: ? Fluids that have a lot of sugar or caffeine. ? Alcohol. ? Spicy or fatty foods. General instructions  Take over-the-counter and prescription medicines only as told by your doctor.  Use a cool mist humidifier to add moisture to the air in your home. This can make it easier for you to breathe.  Cover your mouth and nose when you cough or sneeze.  Wash your hands with soap and  water often, especially after you cough or sneeze. If you cannot use soap and water, use alcohol-based hand sanitizer.  Keep all follow-up visits as told by your doctor. This is important. How is this prevented?   Get a flu shot every year. You may get the flu shot in late summer, fall, or winter. Ask your doctor when you should get your flu shot.  Avoid contact with people who are sick during fall and winter (cold and flu season). Contact a doctor if:  You get new symptoms.  You have: ? Chest pain. ? Watery poop (diarrhea). ? A fever.  Your cough gets worse.  You start to have more mucus.  You feel sick to your stomach.  You throw up. Get help right away if you:  Have shortness of breath.  Have trouble breathing.  Have skin or nails that turn a bluish color.  Have very bad pain or stiffness in your neck.  Get a sudden headache.  Get sudden pain in your face or ear.  Cannot eat or drink without throwing up. Summary  Influenza ("the flu") is an infection in the lungs, nose, and throat. It is caused by a virus.  Take over-the-counter and prescription medicines only as told by your doctor.  Getting a flu shot every year is the best way to avoid getting the flu. This information is not intended to replace advice given to you by your health care provider. Make sure you discuss any questions you have with your health care provider. Document Released: 12/20/2007 Document Revised: 08/28/2017 Document Reviewed: 08/28/2017 Elsevier Interactive Patient Education  2019 [employer].

## 2018-04-30 NOTE — Progress Notes (Signed)
Michelle Knapp , [birth year], 27 y.o., female MRN: [id number] Patient Care Team    Relationship Specialty Notifications Start End  McGowen, Adrian [place], MD PCP - General Family Medicine  10/20/12     Chief Complaint  Patient presents with  . Sinusitis  . Headache  . Generalized Body Aches    Started yeasterday with headache and sinus pressure and then had body aches last night.      Subjective: Pt presents for an OV with complaints of headache of < 24 hours duration.  Associated symptoms include body aches, nasal drainage and mild sore throat. She has not had her  influenza shot this year. Pt has tried advil and aleve to ease their symptoms.   Depression screen [facility] 2/9 02/26/2018 08/01/2017 07/02/2017 09/27/2016  Decreased Interest 0 0 0 0  Down, Depressed, Hopeless 0 0 0 0  PHQ - 2 Score 0 0 0 0  Altered sleeping 1 0 1 -  Tired, decreased energy 1 0 0 -  Change in appetite 0 0 1 -  Feeling bad or failure about yourself  0 0 0 -  Trouble concentrating 0 0 0 -  Moving slowly or fidgety/restless 0 0 0 -  Suicidal thoughts 0 0 0 -  PHQ-9 Score 2 0 2 -  Difficult doing work/chores Not difficult at all - Not difficult at all -    No Known Allergies Social History   Social History Narrative   Married and lives in [place] with husband and 40 mo old son.   Homemaker.   HS: [employer].   Beauty school.     No Tob.  Occ alc.  No drugs.   Exercise: used to walk, o/w none.   Past Medical History:  Diagnosis Date  . Anemia   . Anxiety 02/15/2015  . Cholelithiasis with acute or chronic cholecystitis    Cholecystectomy 11/25/12  . Contraceptive management 03/08/2015  . Depression 02/15/2015  . Encounter for IUD removal 03/08/2015  . Fibromyalgia syndrome   . Irregular intermenstrual bleeding 02/15/2015  . Obesity, Class I, BMI 30-34.9   . 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 Raynaud's phenomenon    Past Surgical History:  Procedure Laterality Date  .  CHOLECYSTECTOMY N/A 11/25/2012   Procedure: LAPAROSCOPIC CHOLECYSTECTOMY WITH INTRAOPERATIVE CHOLANGIOGRAM;  Surgeon: Madilyn Hook, DO;  Location: [facility];  Service: General;  Laterality: N/A;  . FOOT SURGERY  age [age] yrs   arches lowered on both feet: hx of both feet with chronic pain and episodes of turning blue   Family History  Problem Relation Age of Onset  . Fibromyalgia Mother   . Fibromyalgia Sister   . Diabetes Maternal Grandmother   . Diabetes Paternal Grandmother   . Cancer Paternal Grandfather        lungs   Allergies as of 04/30/2018   No Known Allergies     Medication List       Accurate as of April 30, 2018  4:08 PM. Always use your most recent med list.        DULoxetine 60 MG capsule Commonly known as:  CYMBALTA TAKE ONE (1) CAPSULE EACH DAY   norgestimate-ethinyl estradiol 0.25-35 MG-MCG tablet Commonly known as:  PREVIFEM TAKE ONE (1) TABLET EACH DAY   oseltamivir 75 MG capsule Commonly known as:  TAMIFLU Take 1 capsule (75 mg total) by mouth 2 (two) times daily.       All past medical  history, surgical history, allergies, family history, immunizations andmedications were updated in the EMR today and reviewed under the history and medication portions of their EMR.     ROS: Negative, with the exception of above mentioned in HPI   Objective:  BP 124/86 (BP Location: Right Arm, Patient Position: Sitting, Cuff Size: Normal)   Pulse (!) 107   Temp 99.7 F (37.6 C) (Oral)   Resp 16   Ht 5\' 9"  (1.753 m)   Wt 223 lb 2 oz (101.2 kg)   LMP 04/16/2018 (Exact Date)   SpO2 100%   BMI 32.95 kg/m  Body mass index is 32.95 kg/m. Gen: febrile. No acute distress. Nontoxic in appearance, well developed, well nourished.  HENT: AT. [place]. Bilateral TM visualized with erythema or fullness. MMM, no oral lesions. Bilateral nares with erythema, drainage and swelling. Throat without erythema or exudates. No cough.  Eyes:Pupils Equal Round Reactive to light,  Extraocular movements intact,  Conjunctiva without redness, discharge or icterus. Neck/lymp/endocrine: Supple,no lymphadenopathy CV: tachycardic Chest: CTAB, no wheeze or crackles. Good air movement, normal resp effort.  Abd: Soft. NTND. BS present Skin: no rashes, purpura or petechiae.  Neuro: Normal gait. PERLA. EOMi. Alert. Oriented x3   No exam data present No results found. Results for orders placed or performed in visit on 04/30/18 (from the past 24 hour(s))  POC Influenza A&B(BINAX/QUICKVUE)     Status: Abnormal   Collection Time: 04/30/18  3:55 PM  Result Value Ref Range   Influenza A, POC Positive (A) Negative   Influenza B, POC Negative Negative    Assessment/Plan: Michelle Knapp is a 28 y.o. female present for OV for  Generalized body aches/Influenza A - POC Influenza A&B(BINAX/QUICKVUE) Rest, hydrate.  + flonase, mucinex (DM if cough) tamiflu prescribed, take until completed.  If cough present it can last up to 6-8 weeks.  F/U 2 weeks of not improved.      Reviewed expectations re: course of current medical issues.  Discussed self-management of symptoms.  Outlined signs and symptoms indicating need for more acute intervention.  Patient verbalized understanding and all questions were answered.  Patient received an After-Visit Summary.    Orders Placed This Encounter  Procedures  . POC Influenza A&B(BINAX/QUICKVUE)    Note is dictated utilizing voice recognition software. Although note has been proof read prior to signing, occasional typographical errors still can be missed. If any questions arise, please do not hesitate to call for verification.   electronically signed by:  Howard Pouch, DO  [place]

## 2018-08-11 ENCOUNTER — Other Ambulatory Visit: Payer: Self-pay | Admitting: Adult Health

## 2018-08-29 ENCOUNTER — Encounter: Payer: Self-pay | Admitting: Family Medicine

## 2018-08-29 ENCOUNTER — Other Ambulatory Visit: Payer: Self-pay

## 2018-08-29 ENCOUNTER — Encounter: Payer: BC Managed Care – PPO | Admitting: Family Medicine

## 2018-08-29 ENCOUNTER — Ambulatory Visit (INDEPENDENT_AMBULATORY_CARE_PROVIDER_SITE_OTHER): Payer: BC Managed Care – PPO | Admitting: Family Medicine

## 2018-08-29 VITALS — BP 114/72 | HR 73 | Temp 97.2°F | Resp 16 | Ht 69.0 in | Wt 232.4 lb

## 2018-08-29 DIAGNOSIS — E669 Obesity, unspecified: Secondary | ICD-10-CM | POA: Diagnosis not present

## 2018-08-29 DIAGNOSIS — Z0000 Encounter for general adult medical examination without abnormal findings: Secondary | ICD-10-CM | POA: Diagnosis not present

## 2018-08-29 NOTE — Progress Notes (Signed)
Office Note 08/29/2018  CC:  Chief Complaint  Patient presents with  . Annual Exam    pt is fasting    HPI:  Michelle Knapp is a 28 y.o. White female who is here for annual health maintenance exam.  She feels well. No probs with anx, dep, or body/fibro pain lately. No acute complaints. Working on increasing activity level.  Admits she knows what to do regarding healthy diet but "I just need to start doing it".   Past Medical History:  Diagnosis Date  . Anemia   . Anxiety 02/15/2015  . Cholelithiasis with acute or chronic cholecystitis    Cholecystectomy 11/25/12  . Contraceptive management 03/08/2015  . Depression 02/15/2015  . Encounter for IUD removal 03/08/2015  . Fibromyalgia syndrome   . Irregular intermenstrual bleeding 02/15/2015  . Obesity, Class I, BMI 30-34.9   . 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 Raynaud's phenomenon     Past Surgical History:  Procedure Laterality Date  . CHOLECYSTECTOMY N/A 11/25/2012   Procedure: LAPAROSCOPIC CHOLECYSTECTOMY WITH INTRAOPERATIVE CHOLANGIOGRAM;  Surgeon: Madilyn Hook, DO;  Location: [facility];  Service: General;  Laterality: N/A;  . FOOT SURGERY  age [age] yrs   arches lowered on both feet: hx of both feet with chronic pain and episodes of turning blue    Family History  Problem Relation Age of Onset  . Fibromyalgia Mother   . Fibromyalgia Sister   . Diabetes Maternal Grandmother   . Diabetes Paternal Grandmother   . Cancer Paternal Grandfather        lungs    Social History   Socioeconomic History  . Marital status: Married    Spouse name: Not on file  . Number of children: Not on file  . Years of education: Not on file  . Highest education level: Not on file  Occupational History  . Not on file  Social Needs  . Financial resource strain: Not on file  . Food insecurity:    Worry: Not on file    Inability: Not on file  . Transportation needs:    Medical: Not on file    Non-medical: Not  on file  Tobacco Use  . Smoking status: Never Smoker  . Smokeless tobacco: Never Used  Substance and Sexual Activity  . Alcohol use: No    Comment: occassionally  . Drug use: No  . Sexual activity: Yes    Birth control/protection: Pill  Lifestyle  . Physical activity:    Days per week: Not on file    Minutes per session: Not on file  . Stress: Not on file  Relationships  . Social connections:    Talks on phone: Not on file    Gets together: Not on file    Attends religious service: Not on file    Active member of club or organization: Not on file    Attends meetings of clubs or organizations: Not on file    Relationship status: Not on file  . Intimate partner violence:    Fear of current or ex partner: Not on file    Emotionally abused: Not on file    Physically abused: Not on file    Forced sexual activity: Not on file  Other Topics Concern  . Not on file  Social History Narrative   Married and lives in [place] with husband and 62 mo old son.   Homemaker.   HS: [employer].   Beauty school.  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  Occ alc.  No drugs.   Exercise: used to walk, o/w none.    Outpatient Medications Prior to Visit  Medication Sig Dispense Refill  . DULoxetine (CYMBALTA) 60 MG capsule TAKE ONE (1) CAPSULE EACH DAY 30 capsule 5  . ELDERBERRY PO Take by mouth. Take 1 tablet daily.    . norgestimate-ethinyl estradiol (PREVIFEM) 0.25-35 MG-MCG tablet TAKE ONE (1) TABLET EACH DAY 1 Package 3  . oseltamivir (TAMIFLU) 75 MG capsule Take 1 capsule (75 mg total) by mouth 2 (two) times daily. 10 capsule 0   No facility-administered medications prior to visit.     No Known Allergies  ROS Review of Systems  Constitutional: Negative for appetite change, chills, fatigue and fever.  HENT: Negative for congestion, dental problem, ear pain and sore throat.   Eyes: Negative for discharge, redness and visual disturbance.  Respiratory: Negative for cough, chest tightness, shortness of  breath and wheezing.   Cardiovascular: Negative for chest pain, palpitations and leg swelling.  Gastrointestinal: Negative for abdominal pain, blood in stool, diarrhea, nausea and vomiting.  Genitourinary: Negative for difficulty urinating, dysuria, flank pain, frequency, hematuria and urgency.  Musculoskeletal: Negative for arthralgias, back pain, joint swelling, myalgias and neck stiffness.  Skin: Negative for pallor and rash.  Neurological: Negative for dizziness, speech difficulty, weakness and headaches.  Hematological: Negative for adenopathy. Does not bruise/bleed easily.  Psychiatric/Behavioral: Negative for confusion and sleep disturbance. The patient is not nervous/anxious.     PE; Blood pressure 114/72, pulse 73, temperature (!) 97.2 F (36.2 C), temperature source Temporal, resp. rate 16, height 5\' 9"  (1.753 m), weight 232 lb 6.4 oz (105.4 kg), last menstrual period 08/08/2018, SpO2 99 %. Body mass index is 34.32 kg/m.  Exam chaperoned by Deveron Furlong, CMA.  Gen: Alert, well appearing.  Patient is oriented to person, place, time, and situation. AFFECT: pleasant, lucid thought and speech. ENT: Ears: EACs clear, normal epithelium.  TMs with good light reflex and landmarks bilaterally.  Eyes: no injection, icteris, swelling, or exudate.  EOMI, PERRLA. Nose: no drainage or turbinate edema/swelling.  No injection or focal lesion.  Mouth: lips without lesion/swelling.  Oral mucosa pink and moist.  Dentition intact and without obvious caries or gingival swelling.  Oropharynx without erythema, exudate, or swelling.  Neck: supple/nontender.  No LAD, mass, or TM.  Carotid pulses 2+ bilaterally, without bruits. CV: RRR, no m/r/g.   LUNGS: CTA bilat, nonlabored resps, good aeration in all lung fields. ABD: soft, NT, ND, BS normal.  No hepatospenomegaly or mass.  No bruits. EXT: no clubbing, cyanosis, or edema.  Musculoskeletal: no joint swelling, erythema, warmth, or tenderness.  ROM  of all joints intact. Skin - no sores or suspicious lesions or rashes or color changes   Pertinent labs:  Lab Results  Component Value Date   TSH 1.41 02/26/2018   Lab Results  Component Value Date   WBC 6.5 02/26/2018   HGB 13.6 02/26/2018   HCT 41.3 02/26/2018   MCV 87.9 02/26/2018   PLT 237.0 02/26/2018   Lab Results  Component Value Date   CREATININE 0.71 02/26/2018   BUN 18 02/26/2018   NA 139 02/26/2018   K 4.5 02/26/2018   CL 103 02/26/2018   CO2 31 02/26/2018   Lab Results  Component Value Date   ALT 26 02/26/2018   AST 23 02/26/2018   ALKPHOS 72 02/26/2018   BILITOT 0.5 02/26/2018   Lab Results  Component Value Date   CHOL 208 (  H) 02/26/2018   Lab Results  Component Value Date   HDL 64.80 02/26/2018   Lab Results  Component Value Date   LDLCALC 122 (H) 02/26/2018   Lab Results  Component Value Date   TRIG 110.0 02/26/2018   Lab Results  Component Value Date   CHOLHDL 3 02/26/2018    ASSESSMENT AND PLAN:   Health maintenance exam: Reviewed age and gender appropriate health maintenance issues (prudent diet, regular exercise, health risks of tobacco and excessive alcohol, use of seatbelts, fire alarms in home, use of sunscreen).  Also reviewed age and gender appropriate health screening as well as vaccine recommendations. Vaccines: all UTD. Labs: health panel labs all normal 02/2018.  Plan repeat in 6-12 mo. Cervical ca screening: pap normal 10/2016.  Next screening pap due 10/2019 (GYN MD). Breast ca screening: start mammograms age 49.  An After Visit Summary was printed and given to the patient.  FOLLOW UP:  Return in about 6 months (around 02/28/2019) for f/u fibromyalgia syndrome.  Signed:  Crissie Sickles, MD           08/29/2018

## 2018-08-29 NOTE — Patient Instructions (Signed)

## 2018-09-06 ENCOUNTER — Other Ambulatory Visit: Payer: Self-pay | Admitting: Family Medicine

## 2019-01-19 ENCOUNTER — Other Ambulatory Visit: Payer: Self-pay | Admitting: Adult Health

## 2019-03-06 ENCOUNTER — Other Ambulatory Visit: Payer: Self-pay | Admitting: Family Medicine

## 2019-04-04 ENCOUNTER — Other Ambulatory Visit: Payer: Self-pay | Admitting: Family Medicine

## 2019-04-13 ENCOUNTER — Encounter: Payer: Self-pay | Admitting: Family Medicine

## 2019-04-13 ENCOUNTER — Ambulatory Visit (INDEPENDENT_AMBULATORY_CARE_PROVIDER_SITE_OTHER): Payer: BC Managed Care – PPO | Admitting: Family Medicine

## 2019-04-13 ENCOUNTER — Other Ambulatory Visit: Payer: Self-pay

## 2019-04-13 VITALS — BP 110/70 | Wt 225.0 lb

## 2019-04-13 DIAGNOSIS — M797 Fibromyalgia: Secondary | ICD-10-CM

## 2019-04-13 DIAGNOSIS — F411 Generalized anxiety disorder: Secondary | ICD-10-CM

## 2019-04-13 MED ORDER — DULOXETINE HCL 60 MG PO CPEP: ORAL_CAPSULE | ORAL | 3 refills | Status: DC

## 2019-04-13 NOTE — Progress Notes (Signed)
Virtual Visit via Video Note  I connected with pt on 04/13/19 at  3:30 PM EST by a video enabled telemedicine application and verified that I am speaking with the correct person using two identifiers.  Location patient: home Location provider:work or home office Persons participating in the virtual visit: patient, provider  I discussed the limitations of evaluation and management by telemedicine and the availability of in person appointments. The patient expressed understanding and agreed to proceed.  Telemedicine visit is a necessity given the COVID-19 restrictions in place at the current time.  HPI: 29 y/o WF being seen today for f/u fibromyalgia and anxiety. Says everything is going well.  Only hurting mild amount on and off, mainly in neck and shoulders.  Occ uses tylenol at bedtime in addition to her daily duloxetine and is pleased with how things are going at this time. No signif probs with anxiety at all lately.  Mood is good. No side effects from the med.  ROS: no CP, no SOB, no wheezing, no cough, no dizziness, no HAs, no rashes, no melena/hematochezia.  No polyuria or polydipsia.  No myalgias or arthralgias.   Past Medical History:  Diagnosis Date  . Anemia   . Anxiety 02/15/2015  . Cholelithiasis with acute or chronic cholecystitis    Cholecystectomy 11/25/12  . Contraceptive management 03/08/2015  . Depression 02/15/2015  . Encounter for IUD removal 03/08/2015  . Fibromyalgia syndrome   . Irregular intermenstrual bleeding 02/15/2015  . Obesity, Class I, BMI 30-34.9   . 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 Raynaud's phenomenon     Past Surgical History:  Procedure Laterality Date  . CHOLECYSTECTOMY N/A 11/25/2012   Procedure: LAPAROSCOPIC CHOLECYSTECTOMY WITH INTRAOPERATIVE CHOLANGIOGRAM;  Surgeon: Madilyn Hook, DO;  Location: [facility];  Service: General;  Laterality: N/A;  . FOOT SURGERY  age [age] yrs   arches lowered on both feet: hx of both feet  with chronic pain and episodes of turning blue    Family History  Problem Relation Age of Onset  . Fibromyalgia Mother   . Fibromyalgia Sister   . Diabetes Maternal Grandmother   . Diabetes Paternal Grandmother   . Cancer Paternal Grandfather        lungs      Current Outpatient Medications:  .  DULoxetine (CYMBALTA) 60 MG capsule, TAKE ONE (1) CAPSULE EACH DAY, Disp: 30 capsule, Rfl: 0 .  norgestimate-ethinyl estradiol (PREVIFEM) 0.25-35 MG-MCG tablet, TAKE ONE (1) TABLET EACH DAY, Disp: 1 Package, Rfl: 6 .  ELDERBERRY PO, Take by mouth. Take 1 tablet daily., Disp: , Rfl:   EXAM:  VITALS per patient if applicable: BP 123456 (BP Location: Left Arm, Patient Position: Sitting, Cuff Size: Large)   Wt 225 lb (102.1 kg)   BMI 33.23 kg/m    GENERAL: alert, oriented, appears well and in no acute distress  HEENT: atraumatic, conjunttiva clear, no obvious abnormalities on inspection of external nose and ears  NECK: normal movements of the head and neck  LUNGS: on inspection no signs of respiratory distress, breathing rate appears normal, no obvious gross SOB, gasping or wheezing  CV: no obvious cyanosis  MS: moves all visible extremities without noticeable abnormality  PSYCH/NEURO: pleasant and cooperative, no obvious depression or anxiety, speech and thought processing grossly intact  Lab Results  Component Value Date   TSH 1.41 02/26/2018   Lab Results  Component Value Date   WBC 6.5 02/26/2018   HGB 13.6 02/26/2018  HCT 41.3 02/26/2018   MCV 87.9 02/26/2018   PLT 237.0 02/26/2018   Lab Results  Component Value Date   CREATININE 0.71 02/26/2018   BUN 18 02/26/2018   NA 139 02/26/2018   K 4.5 02/26/2018   CL 103 02/26/2018   CO2 31 02/26/2018   Lab Results  Component Value Date   ALT 26 02/26/2018   AST 23 02/26/2018   ALKPHOS 72 02/26/2018   BILITOT 0.5 02/26/2018   Lab Results  Component Value Date   CHOL 208 (H) 02/26/2018   Lab Results   Component Value Date   HDL 64.80 02/26/2018   Lab Results  Component Value Date   LDLCALC 122 (H) 02/26/2018   Lab Results  Component Value Date   TRIG 110.0 02/26/2018   Lab Results  Component Value Date   CHOLHDL 3 02/26/2018    ASSESSMENT AND PLAN:  Discussed the following assessment and plan:  Fibromyalgia and GAD: she is doing very well on duloxetine 60mg  qd. No changes. She'll return in 6 mo for CPE and f/u fibromyalgia.   I discussed the assessment and treatment plan with the patient. The patient was provided an opportunity to ask questions and all were answered. The patient agreed with the plan and demonstrated an understanding of the instructions.   The patient was advised to call back or seek an in-person evaluation if the symptoms worsen or if the condition fails to improve as anticipated.  F/U: 6 mo cpe with labs  Signed:  Crissie Sickles, MD           04/13/2019

## 2019-07-25 DIAGNOSIS — J020 Streptococcal pharyngitis: Secondary | ICD-10-CM

## 2019-07-25 HISTORY — DX: Streptococcal pharyngitis: J02.0

## 2019-08-04 ENCOUNTER — Other Ambulatory Visit: Payer: Self-pay | Admitting: Adult Health

## 2019-08-19 ENCOUNTER — Ambulatory Visit (INDEPENDENT_AMBULATORY_CARE_PROVIDER_SITE_OTHER): Payer: BC Managed Care – PPO | Admitting: Adult Health

## 2019-08-19 ENCOUNTER — Other Ambulatory Visit (HOSPITAL_COMMUNITY)
Admission: RE | Admit: 2019-08-19 | Discharge: 2019-08-19 | Disposition: A | Discharge status: Home / Self Care | Payer: BC Managed Care – PPO | Source: Ambulatory Visit | Attending: Adult Health | Admitting: Adult Health

## 2019-08-19 ENCOUNTER — Encounter: Payer: Self-pay | Admitting: Adult Health

## 2019-08-19 ENCOUNTER — Other Ambulatory Visit: Payer: Self-pay | Admitting: *Deleted

## 2019-08-19 VITALS — BP 116/77 | HR 73 | Ht 69.0 in | Wt 239.0 lb

## 2019-08-19 DIAGNOSIS — Z01419 Encounter for gynecological examination (general) (routine) without abnormal findings: Secondary | ICD-10-CM

## 2019-08-19 DIAGNOSIS — Z3041 Encounter for surveillance of contraceptive pills: Secondary | ICD-10-CM

## 2019-08-19 DIAGNOSIS — E010 Iodine-deficiency related diffuse (endemic) goiter: Secondary | ICD-10-CM | POA: Diagnosis not present

## 2019-08-19 DIAGNOSIS — Z1272 Encounter for screening for malignant neoplasm of vagina: Secondary | ICD-10-CM

## 2019-08-19 DIAGNOSIS — Z1379 Encounter for other screening for genetic and chromosomal anomalies: Secondary | ICD-10-CM

## 2019-08-19 MED ORDER — NORGESTIMATE-ETH ESTRADIOL 0.25-35 MG-MCG PO TABS: ORAL_TABLET | ORAL | 4 refills | Status: DC

## 2019-08-19 MED ORDER — NORGESTIMATE-ETH ESTRADIOL 0.25-35 MG-MCG PO TABS: ORAL_TABLET | ORAL | 12 refills | Status: DC

## 2019-08-19 NOTE — Progress Notes (Signed)
Patient ID: Michelle Knapp, female   DOB: 1991/01/25, 29 y.o.   MRN: [id number] History of Present Illness: Michelle Knapp is a 29 year old white female, married, G2P2 in for a well woman gyn exam and pap. PCP is Dr Ernestine Conrad.    Current Medications, Allergies, Past Medical History, Past Surgical History, Family History and Social History were reviewed in [employer] record.     Review of Systems: Patient denies any headaches, hearing loss, fatigue, blurred vision, shortness of breath, chest pain, abdominal pain, problems with bowel movements, urination, or intercourse. No joint pain or mood swings. Has fibromyalgia, on Cymbalta Periods good on OCs   Physical Exam:BP 116/77 (BP Location: Left Arm, Patient Position: Sitting, Cuff Size: Normal)   Pulse 73   Ht 5\' 9"  (1.753 m)   Wt 239 lb (108.4 kg)   LMP 08/12/2019 (Exact Date)   BMI 35.29 kg/m  General:  Well developed, well nourished, no acute distress Skin:  Warm and dry Neck:  Midline trachea,enlarged thyroid, good ROM, no lymphadenopathy Lungs; Clear to auscultation bilaterally Breast:  No dominant palpable mass, retraction, or nipple discharge Cardiovascular: Regular rate and rhythm Abdomen:  Soft, non tender, no hepatosplenomegaly Pelvic:  External genitalia is normal in appearance, no lesions.  The vagina is normal in appearance. Urethra has no lesions or masses. The cervix is bulbous. Pap with high risk HPV 16/18 genotyping performed. Uterus is felt to be normal size, shape, and contour.  No adnexal masses or tenderness noted.Bladder is non tender, no masses felt. Extremities/musculoskeletal:  No swelling or varicosities noted, no clubbing or cyanosis Psych:  No mood changes, alert and cooperative,seems happy AA 0 Fall risk is low PHQ 9 score is 0. Examination chaperoned by Rolena Infante LPN. She wants to do Empower for genetic cancer screening  Impression and Plan:  1. Encounter for gynecological examination  with Papanicolaou smear of cervix Pap sent Physical in 1 year Pap in 3 if normal Pt requests Empower testing for genetic cancers   2. Encounter for surveillance of contraceptive pills Will refill OCs Rx Previfem 0.25-35 mg-mcg disp 3 packs refill x 4   3. Thyromegaly Stable

## 2019-08-25 LAB — CYTOLOGY - PAP
Comment: NEGATIVE
Diagnosis: NEGATIVE
High risk HPV: NEGATIVE

## 2019-09-03 ENCOUNTER — Telehealth: Payer: Self-pay | Admitting: Adult Health

## 2019-09-03 NOTE — Telephone Encounter (Signed)
Left message I called and need to discuss her empower tests. 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

## 2019-09-07 ENCOUNTER — Encounter: Payer: Self-pay | Admitting: Adult Health

## 2019-09-07 ENCOUNTER — Telehealth: Payer: Self-pay | Admitting: Adult Health

## 2019-09-07 DIAGNOSIS — D126 Benign neoplasm of colon, unspecified: Secondary | ICD-10-CM

## 2019-09-07 HISTORY — DX: Benign neoplasm of colon, unspecified: D12.6

## 2019-09-07 NOTE — Telephone Encounter (Signed)
Discussed with Byrdie that Empower +MUTYH associated polyposis, heterozygous, will get colonoscopy at 89, number to her for genetic counselor

## 2019-09-09 ENCOUNTER — Telehealth: Payer: Self-pay | Admitting: *Deleted

## 2019-09-09 ENCOUNTER — Telehealth: Payer: Self-pay | Admitting: Adult Health

## 2019-09-09 NOTE — Telephone Encounter (Signed)
LMOVM returning patient's call.  Informed I could not give her any information in regards to her sister but she could discuss genetic testing at her next visit with a provider.

## 2019-09-09 NOTE — Telephone Encounter (Signed)
Patient wants to know how to schedule her sister for genetic testing and it be covered under the test she had done for genetic testing.

## 2019-09-10 ENCOUNTER — Telehealth: Payer: Self-pay | Admitting: Obstetrics & Gynecology

## 2019-09-10 ENCOUNTER — Telehealth: Payer: Self-pay | Admitting: *Deleted

## 2019-09-10 NOTE — Telephone Encounter (Signed)
Patient states her sister would like to have the gene test done since she was positive.  Patient informed to have her sister come by the office and we can send the order in for her and she can do the saliva test in the office.  States she will come by next week sometime.

## 2019-09-10 NOTE — Telephone Encounter (Signed)
Patient called stating that her message regarding the genetic testing was misunderstood. Pt states that she was told if her testing comes back positive for colon cancer that when her sister makes an appointment and gets the Genetic testing if we send her positive results along with her sisters genetic test it would be free for her sister. She would like to know if that is true. Please contact the pt

## 2019-09-19 ENCOUNTER — Encounter: Payer: Self-pay | Admitting: Family Medicine

## 2019-10-28 ENCOUNTER — Encounter: Payer: Self-pay | Admitting: *Deleted

## 2019-12-16 ENCOUNTER — Other Ambulatory Visit: Payer: Self-pay

## 2019-12-16 ENCOUNTER — Ambulatory Visit (INDEPENDENT_AMBULATORY_CARE_PROVIDER_SITE_OTHER): Payer: BC Managed Care – PPO | Admitting: Family Medicine

## 2019-12-16 ENCOUNTER — Encounter: Payer: Self-pay | Admitting: Family Medicine

## 2019-12-16 VITALS — BP 120/79 | HR 95 | Temp 98.0°F | Resp 12 | Ht 69.0 in | Wt 260.2 lb

## 2019-12-16 DIAGNOSIS — E669 Obesity, unspecified: Secondary | ICD-10-CM

## 2019-12-16 DIAGNOSIS — Z0000 Encounter for general adult medical examination without abnormal findings: Secondary | ICD-10-CM | POA: Diagnosis not present

## 2019-12-16 DIAGNOSIS — M797 Fibromyalgia: Secondary | ICD-10-CM | POA: Diagnosis not present

## 2019-12-16 DIAGNOSIS — F419 Anxiety disorder, unspecified: Secondary | ICD-10-CM | POA: Diagnosis not present

## 2019-12-16 DIAGNOSIS — F329 Major depressive disorder, single episode, unspecified: Secondary | ICD-10-CM

## 2019-12-16 DIAGNOSIS — F32A Depression, unspecified: Secondary | ICD-10-CM

## 2019-12-16 NOTE — Patient Instructions (Signed)
Health Maintenance, Female Adopting a healthy lifestyle and getting preventive care are important in promoting health and wellness. Ask your health care provider about:  The right schedule for you to have regular tests and exams.  Things you can do on your own to prevent diseases and keep yourself healthy. What should I know about diet, weight, and exercise? Eat a healthy diet   Eat a diet that includes plenty of vegetables, fruits, low-fat dairy products, and lean protein.  Do not eat a lot of foods that are high in solid fats, added sugars, or sodium. Maintain a healthy weight Body mass index (BMI) is used to identify weight problems. It estimates body fat based on height and weight. Your health care provider can help determine your BMI and help you achieve or maintain a healthy weight. Get regular exercise Get regular exercise. This is one of the most important things you can do for your health. Most adults should:  Exercise for at least 150 minutes each week. The exercise should increase your heart rate and make you sweat (moderate-intensity exercise).  Do strengthening exercises at least twice a week. This is in addition to the moderate-intensity exercise.  Spend less time sitting. Even light physical activity can be beneficial. Watch cholesterol and blood lipids Have your blood tested for lipids and cholesterol at 31 years of age, then have this test every 5 years. Have your cholesterol levels checked more often if:  Your lipid or cholesterol levels are high.  You are older than 31 years of age.  You are at high risk for heart disease. What should I know about cancer screening? Depending on your health history and family history, you may need to have cancer screening at various ages. This may include screening for:  Breast cancer.  Cervical cancer.  Colorectal cancer.  Skin cancer.  Lung cancer. What should I know about heart disease, diabetes, and high blood  pressure? Blood pressure and heart disease  High blood pressure causes heart disease and increases the risk of stroke. This is more likely to develop in people who have high blood pressure readings, are of African descent, or are overweight.  Have your blood pressure checked: ? Every 3-5 years if you are 18-39 years of age. ? Every year if you are 40 years old or older. Diabetes Have regular diabetes screenings. This checks your fasting blood sugar level. Have the screening done:  Once every three years after age 40 if you are at a normal weight and have a low risk for diabetes.  More often and at a younger age if you are overweight or have a high risk for diabetes. What should I know about preventing infection? Hepatitis B If you have a higher risk for hepatitis B, you should be screened for this virus. Talk with your health care provider to find out if you are at risk for hepatitis B infection. Hepatitis C Testing is recommended for:  Everyone born from 1945 through 1965.  Anyone with known risk factors for hepatitis C. Sexually transmitted infections (STIs)  Get screened for STIs, including gonorrhea and chlamydia, if: ? You are sexually active and are younger than 31 years of age. ? You are older than 31 years of age and your health care provider tells you that you are at risk for this type of infection. ? Your sexual activity has changed since you were last screened, and you are at increased risk for chlamydia or gonorrhea. Ask your health care provider if   you are at risk.  Ask your health care provider about whether you are at high risk for HIV. Your health care provider may recommend a prescription medicine to help prevent HIV infection. If you choose to take medicine to prevent HIV, you should first get tested for HIV. You should then be tested every 3 months for as long as you are taking the medicine. Pregnancy  If you are about to stop having your period (premenopausal) and  you may become pregnant, seek counseling before you get pregnant.  Take 400 to 800 micrograms (mcg) of folic acid every day if you become pregnant.  Ask for birth control (contraception) if you want to prevent pregnancy. Osteoporosis and menopause Osteoporosis is a disease in which the bones lose minerals and strength with aging. This can result in bone fractures. If you are 65 years old or older, or if you are at risk for osteoporosis and fractures, ask your health care provider if you should:  Be screened for bone loss.  Take a calcium or vitamin D supplement to lower your risk of fractures.  Be given hormone replacement therapy (HRT) to treat symptoms of menopause. Follow these instructions at home: Lifestyle  Do not use any products that contain nicotine or tobacco, such as cigarettes, e-cigarettes, and chewing tobacco. If you need help quitting, ask your health care provider.  Do not use street drugs.  Do not share needles.  Ask your health care provider for help if you need support or information about quitting drugs. Alcohol use  Do not drink alcohol if: ? Your health care provider tells you not to drink. ? You are pregnant, may be pregnant, or are planning to become pregnant.  If you drink alcohol: ? Limit how much you use to 0-1 drink a day. ? Limit intake if you are breastfeeding.  Be aware of how much alcohol is in your drink. In the [place] one drink equals one 12 oz bottle of beer (355 mL), one 5 oz glass of wine (148 mL), or one 1 oz glass of hard liquor (44 mL). General instructions  Schedule regular health, dental, and eye exams.  Stay current with your vaccines.  Tell your health care provider if: ? You often feel depressed. ? You have ever been abused or do not feel safe at home. Summary  Adopting a healthy lifestyle and getting preventive care are important in promoting health and wellness.  Follow your health care provider's instructions about healthy  diet, exercising, and getting tested or screened for diseases.  Follow your health care provider's instructions on monitoring your cholesterol and blood pressure. This information is not intended to replace advice given to you by your health care provider. Make sure you discuss any questions you have with your health care provider. Document Revised: 03/05/2018 Document Reviewed: 03/05/2018 Elsevier Patient Education  2020 [employer]  

## 2019-12-16 NOTE — Progress Notes (Signed)
Office Note 12/16/2019  CC:  Chief Complaint  Patient presents with  . Annual Exam    HPI:  Michelle Knapp is a 29 y.o. White female who is here for annual health maintenance exam and f/u fibromyalgia, anx/dep. I last saw her about 8 mo ago. A/P as of last visit: "Fibromyalgia and GAD: she is doing very well on duloxetine 60mg  qd. No changes. She'll return in 6 mo for CPE and f/u fibromyalgia."  INTERIM HX: Got annual GYN exam, all normal, including pap. OCPs were RF'd.  Not eating healthy or exercising. She is motivated to restart good habits.  Wt has fluctuated a lot all her life.  No physical complaints.  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  Her son is 11 yrs old now! Feels like cymbalta helping appropriately for mood, anxiety, and fibromyalgia.   Most recent flare of fibromyalgia was 10/2019, a little bit of everywhere hurts when this happens.  Typically lasts 2 days or so with a flare.   Past Medical History:  Diagnosis Date  . Anemia   . Anxiety 02/15/2015  . Cholelithiasis with acute or chronic cholecystitis    Cholecystectomy 11/25/12  . Contraceptive management 03/08/2015  . Depression 02/15/2015  . Encounter for IUD removal 03/08/2015  . Fibromyalgia syndrome   . Irregular intermenstrual bleeding 02/15/2015  . Obesity, Class I, BMI 30-34.9   . 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 Raynaud's phenomenon   . Strep pharyngitis 07/2019   [facility]  . Thyromegaly    ultrasound 09/2012 benign-appearing    Past Surgical History:  Procedure Laterality Date  . CHOLECYSTECTOMY N/A 11/25/2012   Procedure: LAPAROSCOPIC CHOLECYSTECTOMY WITH INTRAOPERATIVE CHOLANGIOGRAM;  Surgeon: Madilyn Hook, DO;  Location: [facility];  Service: General;  Laterality: N/A;  . FOOT SURGERY  age [age] yrs    arches lowered on both feet: hx of both feet with chronic pain and episodes of turning blue    Family History  Problem Relation Age of Onset  . Fibromyalgia Mother   . Fibromyalgia Sister   . Diabetes Maternal Grandmother   . Diabetes Paternal Grandmother   . Cancer Paternal Grandfather        lungs    Social History   Socioeconomic History  . Marital status: Married    Spouse name: Not on file  . Number of children: Not on file  . Years of education: Not on file  . Highest education level: Not on file  Occupational History  . Not on file  Tobacco Use  . Smoking status: Never Smoker  . Smokeless tobacco: Never Used  Vaping Use  . Vaping Use: Never used  Substance and Sexual Activity  . Alcohol use: No    Comment: occassionally  . Drug use: No  . Sexual activity: Yes    Birth control/protection: Pill  Other Topics Concern  . Not on file  Social History Narrative   Married and lives in [place] with husband and 103 mo old son.   Homemaker.   HS: [employer].   Beauty school.     No Tob.  Occ alc.  No drugs.   Exercise: used to walk, o/w none.   Social Determinants of Health   Financial Resource Strain:   . Difficulty of Paying Living Expenses:  Not on file  Food Insecurity: No Food Insecurity  . Worried About Charity fundraiser in the Last Year: Never true  . Ran Out of Food in the Last Year: Never true  Transportation Needs: No Transportation Needs  . Lack of Transportation (Medical): No  . Lack of Transportation (Non-Medical): No  Physical Activity: Insufficiently Active  . Days of Exercise per Week: 3 days  . Minutes of Exercise per Session: 30 min  Stress:   . Feeling of Stress : Not on file  Social Connections: Socially Integrated  . Frequency of Communication with Friends and Family: More than three times a week  . Frequency of Social Gatherings with Friends and Family: Once a week  . Attends Religious Services: More than 4 times per year  .  Active Member of Clubs or Organizations: Yes  . Attends Archivist Meetings: 1 to 4 times per year  . Marital Status: Married  Human resources officer Violence: Not At Risk  . Fear of Current or Ex-Partner: No  . Emotionally Abused: No  . Physically Abused: No  . Sexually Abused: No    Outpatient Medications Prior to Visit  Medication Sig Dispense Refill  . DULoxetine (CYMBALTA) 60 MG capsule TAKE ONE (1) CAPSULE EACH DAY 90 capsule 3  . norgestimate-ethinyl estradiol (PREVIFEM) 0.25-35 MG-MCG tablet TAKE ONE (1) TABLET EACH DAY 3 Package 4   No facility-administered medications prior to visit.    No Known Allergies  ROS Review of Systems  Constitutional: Negative for appetite change, chills, fatigue and fever.  HENT: Negative for congestion, dental problem, ear pain and sore throat.   Eyes: Negative for discharge, redness and visual disturbance.  Respiratory: Negative for cough, chest tightness, shortness of breath and wheezing.   Cardiovascular: Negative for chest pain, palpitations and leg swelling.  Gastrointestinal: Negative for abdominal pain, blood in stool, diarrhea, nausea and vomiting.  Genitourinary: Negative for difficulty urinating, dysuria, flank pain, frequency, hematuria and urgency.  Musculoskeletal: Negative for arthralgias, back pain, joint swelling, myalgias and neck stiffness.  Skin: Negative for pallor and rash.  Neurological: Negative for dizziness, speech difficulty, weakness and headaches.  Hematological: Negative for adenopathy. Does not bruise/bleed easily.  Psychiatric/Behavioral: Negative for confusion and sleep disturbance. The patient is not nervous/anxious.     PE; Vitals with BMI 12/16/2019 08/19/2019 04/13/2019  Height 5\' 9"  5\' 9"  -  Weight 260 lbs 3 oz 239 lbs 225 lbs  BMI 53.29 92.42 -  Systolic [phone]  Diastolic 79 77 70  Pulse 95 73 -   Exam chaperoned by Shepard General, CMA Gen: Alert, well appearing.  Patient is oriented to  person, place, time, and situation. AFFECT: pleasant, lucid thought and speech. ENT: Ears: EACs clear, normal epithelium.  TMs with good light reflex and landmarks bilaterally.  Eyes: no injection, icteris, swelling, or exudate.  EOMI, PERRLA. Nose: no drainage or turbinate edema/swelling.  No injection or focal lesion.  Mouth: lips without lesion/swelling.  Oral mucosa pink and moist.  Dentition intact and without obvious caries or gingival swelling.  Oropharynx without erythema, exudate, or swelling.  Neck: supple/nontender.  No LAD, mass, or TM.  Carotid pulses 2+ bilaterally, without bruits. CV: RRR, no m/r/g.   LUNGS: CTA bilat, nonlabored resps, good aeration in all lung fields. ABD: soft, NT, ND, BS normal.  No hepatospenomegaly or mass.  No bruits. EXT: no clubbing, cyanosis, or edema.  Musculoskeletal: no joint swelling, erythema, warmth, or tenderness.  ROM of all joints  intact. Skin - no sores or suspicious lesions or rashes or color changes   Pertinent labs:  Lab Results  Component Value Date   TSH 1.41 02/26/2018   Lab Results  Component Value Date   WBC 6.5 02/26/2018   HGB 13.6 02/26/2018   HCT 41.3 02/26/2018   MCV 87.9 02/26/2018   PLT 237.0 02/26/2018   Lab Results  Component Value Date   CREATININE 0.71 02/26/2018   BUN 18 02/26/2018   NA 139 02/26/2018   K 4.5 02/26/2018   CL 103 02/26/2018   CO2 31 02/26/2018   Lab Results  Component Value Date   ALT 26 02/26/2018   AST 23 02/26/2018   ALKPHOS 72 02/26/2018   BILITOT 0.5 02/26/2018   Lab Results  Component Value Date   CHOL 208 (H) 02/26/2018   Lab Results  Component Value Date   HDL 64.80 02/26/2018   Lab Results  Component Value Date   LDLCALC 122 (H) 02/26/2018   Lab Results  Component Value Date   TRIG 110.0 02/26/2018   Lab Results  Component Value Date   CHOLHDL 3 02/26/2018    ASSESSMENT AND PLAN:   Health maintenance exam: Reviewed age and gender appropriate health  maintenance issues (prudent diet, regular exercise, health risks of tobacco and excessive alcohol, use of seatbelts, fire alarms in home, use of sunscreen).  Also reviewed age and gender appropriate health screening as well as vaccine recommendations. Vaccines: Tdap UTD.  Flu->declined.  Covid 19->UTD. Labs: fasting HP labs ordered--she's not fasting today so she'll make future fasting lab appt for these labs. Cervical ca screening: UTD with her GYN provider, next pap to be 2024. Breast ca screening: start annual mammograms age 35. Colon ca screening: pt genetically at higher risk for colon cancer (see PMH section above)--she'll be getting her first colonoscopy at age 27 yrs.  An After Visit Summary was printed and given to the patient.  FOLLOW UP:  No follow-ups on file.  Signed:  Crissie Sickles, MD           12/16/2019

## 2020-02-15 ENCOUNTER — Other Ambulatory Visit: Payer: Self-pay | Admitting: Family Medicine

## 2020-03-16 ENCOUNTER — Telehealth: Payer: Self-pay | Admitting: *Deleted

## 2020-03-16 ENCOUNTER — Telehealth: Payer: Self-pay | Admitting: Adult Health

## 2020-03-16 NOTE — Telephone Encounter (Signed)
Patient informed I have contacted the [employer] Rep and have emailed her insurance information to her as well.  She is going to look into this and get in touch with her.  Advised patient if she had not heard from her the middle of next week, to let us know.  Pt verbalized understanding.

## 2020-03-16 NOTE — Telephone Encounter (Signed)
Patient called yesterday and left a message for [facility] regarding her Genetic testing. PT states that she knows it was $800 but she is stating that they state they never received her insurance information. Pt would like to know if we could resend her insurance information to them or if there is something else she can do. Please contact pt

## 2020-09-05 ENCOUNTER — Other Ambulatory Visit: Payer: Self-pay | Admitting: Adult Health

## 2020-12-26 ENCOUNTER — Other Ambulatory Visit: Payer: Self-pay | Admitting: Family Medicine

## 2020-12-30 ENCOUNTER — Telehealth: Payer: Self-pay

## 2020-12-30 NOTE — Telephone Encounter (Signed)
Patient due for CPE.  Last one  12/16/19  Attempted to contact patient to schedule cpe.  Both numbers on her profile were invalid.

## 2021-01-27 ENCOUNTER — Encounter: Payer: Self-pay | Admitting: Family Medicine

## 2021-01-27 ENCOUNTER — Other Ambulatory Visit: Payer: Self-pay

## 2021-01-27 ENCOUNTER — Ambulatory Visit: Payer: BC Managed Care – PPO | Admitting: Family Medicine

## 2021-01-27 VITALS — BP 129/84 | HR 89 | Temp 98.2°F | Ht 69.0 in | Wt 269.8 lb

## 2021-01-27 DIAGNOSIS — B370 Candidal stomatitis: Secondary | ICD-10-CM | POA: Diagnosis not present

## 2021-01-27 DIAGNOSIS — J069 Acute upper respiratory infection, unspecified: Secondary | ICD-10-CM

## 2021-01-27 MED ORDER — FLUCONAZOLE 150 MG PO TABS
ORAL_TABLET | ORAL | 0 refills | Status: DC
Start: 2021-01-27 — End: 2021-05-02

## 2021-01-27 MED ORDER — NYSTATIN 100000 UNIT/ML MT SUSP: 5.0000 mL | Freq: Four times a day (QID) | OROMUCOSAL | 0 refills | Status: DC

## 2021-01-27 NOTE — Progress Notes (Signed)
OFFICE VISIT  01/27/2021  CC:  Chief Complaint  Patient presents with   Michelle Knapp to UC recently; given rx for antibiotics(cefdinir and benzonatate) Has been using fluconazole tabs that she already had at home, was given steroid shot of methylprednisolone.     HPI:    Patient is a 30 y.o. female who presents for possible thrush.  HPI: Onset about 3 days ago of feeling discomfort in her mouth and throat, noticing some white patches on tongue and inside of mouth, also on uvula.  She thinks this all started right around the time she went to the [facility] urgent care at [facility] 2 days ago.  She presented there for about 1 week history of nasal congestion, sinus congestion, cough.  Symptoms had not been improving.  She had COVID, flu, and RSV testing and with these were all negative.  No shortness of breath, no wheezing, no chest pain, no fever. She was prescribed Omnicef and was given a steroid injection.  She had a Diflucan at home and took this yesterday and feels like things are little better today regarding the white spots.  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 09/07/2019   Pt aware, will get colonoscopy at 3   Raynaud's phenomenon    Strep pharyngitis 07/2019   [facility]   Thyromegaly    ultrasound 09/2012 benign-appearing    Past Surgical History:  Procedure Laterality Date   CHOLECYSTECTOMY N/A 11/25/2012   Procedure: LAPAROSCOPIC CHOLECYSTECTOMY WITH INTRAOPERATIVE CHOLANGIOGRAM;  Surgeon: Madilyn Hook, DO;  Location: [facility];  Service: General;  Laterality: N/A;   FOOT SURGERY  age [age] yrs    arches lowered on both feet: hx of both feet with chronic pain and episodes of turning blue   IUD REMOVAL  03/08/2015    Outpatient Medications Prior to Visit  Medication Sig Dispense Refill   cefdinir (OMNICEF) 300 MG capsule Take 300 mg by mouth 2 (two) times daily.     DULoxetine (CYMBALTA) 60 MG capsule TAKE ONE (1) CAPSULE EACH DAY 30 capsule 0   norgestimate-ethinyl estradiol (PREVIFEM) 0.25-35 MG-MCG tablet TAKE ONE (1) TABLET EACH DAY 84 tablet 4   benzonatate (TESSALON) 100 MG capsule Take by mouth. (Patient not taking: Reported on 01/27/2021)     No facility-administered medications prior to visit.    No Known Allergies  ROS As per HPI  PE: Vitals with BMI 01/27/2021 12/16/2019 08/19/2019  Height 5\' 9"  5\' 9"  5\' 9"   Weight 269 lbs 13 oz 260 lbs 3 oz 239 lbs  BMI 39.82 26.33 35.45  Systolic 625 638 937  Diastolic 84 79 77  Pulse 89 95 73     Gen: Alert, well appearing.  Patient is oriented to person, place, time, and situation. Oral: no plaques.  Minimal whitish film over tongue.  Soft palate with a couple of splotches of erythema and minimal uvula swelling.  No tonsilar or post pharyngeal erythema, exudate, or swelling. CV: RRR, no m/r/g.   LUNGS: CTA bilat, nonlabored resps, good aeration in all lung fields.  LABS:  none  IMPRESSION AND  PLAN:  1) thrush, near complete resolution with Diflucan yesterday.  We will go ahead and treat with a Diflucan today with instructions to take a repeat dose in 1 Week.  Also 1 teaspoon nystatin suspension 4 times daily swish and gargle and spit x14 days. Her URI with cough and congestion is stable at this point and she will finish the [facility] prescribed by the urgent care.  An After Visit Summary was printed and given to the patient.  FOLLOW UP: No follow-ups on file.  Signed:  Crissie Sickles, MD           01/27/2021

## 2021-05-01 ENCOUNTER — Other Ambulatory Visit: Payer: Self-pay

## 2021-05-02 ENCOUNTER — Encounter: Payer: Self-pay | Admitting: Family Medicine

## 2021-05-02 ENCOUNTER — Telehealth: Payer: Self-pay | Admitting: Family Medicine

## 2021-05-02 ENCOUNTER — Ambulatory Visit (INDEPENDENT_AMBULATORY_CARE_PROVIDER_SITE_OTHER): Payer: BC Managed Care – PPO | Admitting: Family Medicine

## 2021-05-02 VITALS — BP 120/78 | HR 83 | Temp 97.9°F | Ht 68.5 in | Wt 256.2 lb

## 2021-05-02 DIAGNOSIS — E785 Hyperlipidemia, unspecified: Secondary | ICD-10-CM

## 2021-05-02 DIAGNOSIS — M797 Fibromyalgia: Secondary | ICD-10-CM

## 2021-05-02 DIAGNOSIS — Z8659 Personal history of other mental and behavioral disorders: Secondary | ICD-10-CM

## 2021-05-02 DIAGNOSIS — Z0000 Encounter for general adult medical examination without abnormal findings: Secondary | ICD-10-CM | POA: Diagnosis not present

## 2021-05-02 LAB — CBC WITH DIFFERENTIAL/PLATELET
Basophils Absolute: 0 10*3/uL (ref 0.0–0.1)
Basophils Relative: 0.6 % (ref 0.0–3.0)
Eosinophils Absolute: 0.1 10*3/uL (ref 0.0–0.7)
Eosinophils Relative: 1.1 % (ref 0.0–5.0)
HCT: 42.2 % (ref 36.0–46.0)
Hemoglobin: 13.5 g/dL (ref 12.0–15.0)
Lymphocytes Relative: 28.4 % (ref 12.0–46.0)
Lymphs Abs: 2 10*3/uL (ref 0.7–4.0)
MCHC: 32 g/dL (ref 30.0–36.0)
MCV: 86.6 fl (ref 78.0–100.0)
Monocytes Absolute: 0.7 10*3/uL (ref 0.1–1.0)
Monocytes Relative: 9.7 % (ref 3.0–12.0)
Neutro Abs: 4.3 10*3/uL (ref 1.4–7.7)
Neutrophils Relative %: 60.2 % (ref 43.0–77.0)
Platelets: 204 10*3/uL (ref 150.0–400.0)
RBC: 4.87 Mil/uL (ref 3.87–5.11)
RDW: 14.4 % (ref 11.5–15.5)
WBC: 7.2 10*3/uL (ref 4.0–10.5)

## 2021-05-02 LAB — COMPREHENSIVE METABOLIC PANEL
ALT: 37 U/L — ABNORMAL HIGH (ref 0–35)
AST: 22 U/L (ref 0–37)
Albumin: 4 g/dL (ref 3.5–5.2)
Alkaline Phosphatase: 72 U/L (ref 39–117)
BUN: 9 mg/dL (ref 6–23)
CO2: 32 mEq/L (ref 19–32)
Calcium: 9.5 mg/dL (ref 8.4–10.5)
Chloride: 104 mEq/L (ref 96–112)
Creatinine, Ser: 0.75 mg/dL (ref 0.40–1.20)
GFR: 105.02 mL/min (ref >60.00)
Glucose, Bld: 85 mg/dL (ref 70–99)
Potassium: 4.2 mEq/L (ref 3.5–5.1)
Sodium: 140 mEq/L (ref 135–145)
Total Bilirubin: 0.4 mg/dL (ref 0.2–1.2)
Total Protein: 6.5 g/dL (ref 6.0–8.3)

## 2021-05-02 LAB — TSH: TSH: 1.38 u[IU]/mL (ref 0.35–5.50)

## 2021-05-02 LAB — LIPID PANEL
Cholesterol: 161 mg/dL (ref 0–200)
HDL: 46.5 mg/dL (ref >39.00)
LDL Cholesterol: 91 mg/dL (ref 0–99)
NonHDL: 114.65
Total CHOL/HDL Ratio: 3
Triglycerides: 120 mg/dL (ref 0.0–149.0)
VLDL: 24 mg/dL (ref 0.0–40.0)

## 2021-05-02 MED ORDER — DULOXETINE HCL 60 MG PO CPEP: ORAL_CAPSULE | ORAL | 3 refills | Status: DC

## 2021-05-02 NOTE — Telephone Encounter (Signed)
Pt wanting to know can her & her husband be prescribed the patches behind the ear for her cruise coming up in May.   Pt cell: [phone]

## 2021-05-02 NOTE — Patient Instructions (Signed)

## 2021-05-02 NOTE — Progress Notes (Addendum)
Office Note 05/02/2021  CC:  Chief Complaint  Patient presents with   Annual Exam    Pt is fasting    HPI:  Patient is a 31 y.o. female who is here for annual health maintenance exam and f/u fibromyalgia and hx of anxiety and depression. Doing great. Has been dieting/exercising and has lost about 20 lbs in last few months.  Cymbalta helping her fibromyalgia well.    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   Pt aware, will get colonoscopy at 67   Raynaud's phenomenon    Thyromegaly    ultrasound 09/2012 benign-appearing    Past Surgical History:  Procedure Laterality Date   CHOLECYSTECTOMY N/A 11/25/2012   Procedure: LAPAROSCOPIC CHOLECYSTECTOMY WITH INTRAOPERATIVE CHOLANGIOGRAM;  Surgeon: Madilyn Hook, DO;  Location: [facility];  Service: General;  Laterality: N/A;   FOOT SURGERY  age [age] yrs   arches lowered on both feet: hx of both feet with chronic pain and episodes of turning blue   IUD REMOVAL  03/08/2015    Family History  Problem Relation Age of Onset   Fibromyalgia Mother    Fibromyalgia Sister    Diabetes Maternal Grandmother    Diabetes Paternal Grandmother    Cancer Paternal Grandfather        lungs    Social History   Socioeconomic History   Marital status: Married    Spouse name: Not on file   Number of children: Not on file   Years of education: Not on file   Highest education level: Not on file  Occupational History   Not on file  Tobacco Use   Smoking status: Never   Smokeless tobacco: Never  Vaping Use   Vaping Use: Never used  Substance and Sexual Activity   Alcohol use: No    Comment: occassionally   Drug use: No   Sexual activity: Yes    Birth control/protection: Pill   Other Topics Concern   Not on file  Social History Narrative   Married and lives in [place] with husband and son and daughter.   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     No Tob.  Occ alc.  No drugs.   Social Determinants of Health   Financial Resource Strain: Not on file  Food Insecurity: Not on file  Transportation Needs: Not on file  Physical Activity: Not on file  Stress: Not on file  Social Connections: Not on file  Intimate Partner Violence: Not on file    Outpatient Medications Prior to Visit  Medication Sig Dispense Refill   norgestimate-ethinyl estradiol (PREVIFEM) 0.25-35 MG-MCG tablet TAKE ONE (1) TABLET EACH DAY 84 tablet 4   DULoxetine (CYMBALTA) 60 MG capsule TAKE ONE (1) CAPSULE EACH DAY 30 capsule 0   benzonatate (TESSALON) 100 MG capsule Take by mouth. (Patient not taking: Reported on 01/27/2021)     cefdinir (OMNICEF) 300 MG capsule Take 300 mg by mouth 2 (two) times daily.     fluconazole (DIFLUCAN) 150 MG tablet 1 tab po now and 1 tab po in 1 week 2 tablet 0   nystatin (MYCOSTATIN) 100000 UNIT/ML suspension Take 5 mLs (500,000 Units total) by mouth 4 (four) times daily. 240 mL 0   No  facility-administered medications prior to visit.    No Known Allergies  ROS Review of Systems  Constitutional:  Negative for appetite change, chills, fatigue and fever.  HENT:  Negative for congestion, dental problem, ear pain and sore throat.   Eyes:  Negative for discharge, redness and visual disturbance.  Respiratory:  Negative for cough, chest tightness, shortness of breath and wheezing.   Cardiovascular:  Negative for chest pain, palpitations and leg swelling.  Gastrointestinal:  Negative for abdominal pain, blood in stool, diarrhea, nausea and vomiting.  Genitourinary:  Negative for difficulty urinating, dysuria, flank pain, frequency, hematuria and urgency.  Musculoskeletal:  Negative for arthralgias, back pain, joint swelling, myalgias and neck stiffness.   Skin:  Negative for pallor and rash.  Neurological:  Negative for dizziness, speech difficulty, weakness and headaches.  Hematological:  Negative for adenopathy. Does not bruise/bleed easily.  Psychiatric/Behavioral:  Negative for confusion and sleep disturbance. The patient is not nervous/anxious.    PE; Vitals with BMI 05/02/2021 01/27/2021 12/16/2019  Height 5' 8.5" 5\' 9"  5\' 9"   Weight 256 lbs 3 oz 269 lbs 13 oz 260 lbs 3 oz  BMI 38.38 29.52 84.13  Systolic 244 010 272  Diastolic 78 84 79  Pulse 83 89 95   Gen: Alert, well appearing.  Patient is oriented to person, place, time, and situation. AFFECT: pleasant, lucid thought and speech. ENT: Ears: EACs clear, normal epithelium.  TMs with good light reflex and landmarks bilaterally.  Eyes: no injection, icteris, swelling, or exudate.  EOMI, PERRLA. Nose: no drainage or turbinate edema/swelling.  No injection or focal lesion.  Mouth: lips without lesion/swelling.  Oral mucosa pink and moist.  Dentition intact and without obvious caries or gingival swelling.  Oropharynx without erythema, exudate, or swelling.  Neck: supple/nontender.  No LAD, mass, or TM.  Carotid pulses 2+ bilaterally, without bruits. CV: RRR, no m/r/g.   LUNGS: CTA bilat, nonlabored resps, good aeration in all lung fields. ABD: soft, NT, ND, BS normal.  No hepatospenomegaly or mass.  No bruits. EXT: no clubbing, cyanosis, or edema.  Musculoskeletal: no joint swelling, erythema, warmth, or tenderness.  ROM of all joints intact. Skin - no sores or suspicious lesions or rashes or color changes  Pertinent labs:  Lab Results  Component Value Date   TSH 1.41 02/26/2018   Lab Results  Component Value Date   WBC 6.5 02/26/2018   HGB 13.6 02/26/2018   HCT 41.3 02/26/2018   MCV 87.9 02/26/2018   PLT 237.0 02/26/2018   Lab Results  Component Value Date   CREATININE 0.71 02/26/2018   BUN 18 02/26/2018   NA 139 02/26/2018   K 4.5 02/26/2018   CL 103 02/26/2018   CO2  31 02/26/2018   Lab Results  Component Value Date   ALT 26 02/26/2018   AST 23 02/26/2018   ALKPHOS 72 02/26/2018   BILITOT 0.5 02/26/2018   Lab Results  Component Value Date   CHOL 208 (H) 02/26/2018   Lab Results  Component Value Date   HDL 64.80 02/26/2018   Lab Results  Component Value Date   LDLCALC 122 (H) 02/26/2018   Lab Results  Component Value Date   TRIG 110.0 02/26/2018   Lab Results  Component Value Date   CHOLHDL 3 02/26/2018   ASSESSMENT AND PLAN:   Health maintenance exam: Reviewed age and gender appropriate health maintenance issues (prudent diet, regular exercise, health risks of tobacco and excessive alcohol, use of seatbelts, fire alarms in home,  use of sunscreen).  Also reviewed age and gender appropriate health screening as well as vaccine recommendations. Vaccines: Tdap UTD.  Flu->declined.   Labs: fasting HP labs ordered. Cervical ca screening: UTD with her GYN provider, next pap to be 2024. Breast ca screening: start annual mammograms age 45. Colon ca screening: pt genetically at higher risk for colon cancer (see PMH section above)--she'll be getting her first colonoscopy at age 23 yrs.  Fibromyalgia: well controlled on cymbalta 60 qd--RF'd today.  An After Visit Summary was printed and given to the patient.  FOLLOW UP:  Return in about 1 year (around 05/02/2022) for annual CPE (fasting).  Signed:  Crissie Sickles, MD           05/02/2021

## 2021-05-02 NOTE — Telephone Encounter (Signed)
Last rx for scopolamine patches written 09/30/16, 4 patches w/ 1 refill.   Please fill, if appropriate. Separate message sent for pt's husband.

## 2021-05-03 MED ORDER — SCOPOLAMINE 1 MG/3DAYS TD PT72: 1.0000 | MEDICATED_PATCH | TRANSDERMAL | 1 refills | Status: DC

## 2021-05-03 NOTE — Telephone Encounter (Signed)
Prescription sent

## 2021-05-03 NOTE — Telephone Encounter (Signed)
Pt advised rx sent, voiced understanding.

## 2021-06-06 ENCOUNTER — Telehealth: Payer: Self-pay | Admitting: *Deleted

## 2021-06-06 NOTE — Telephone Encounter (Signed)
Would like to ask some questions about the genetic testing she done two years ago. Please advise.  ?

## 2021-06-08 NOTE — Telephone Encounter (Signed)
Humberto Leep,  ? ?I have reached out to the [facility] rep who is looking into this for you.  Hopefully, you should hear something from them soon.  ? ?Tish ?

## 2021-06-27 ENCOUNTER — Telehealth: Payer: Self-pay | Admitting: *Deleted

## 2021-06-27 NOTE — Telephone Encounter (Signed)
Patient wanted to talk to you about the billing for genetic testing she had talked to you about before. She's still getting bills. Please advise.  ?

## 2021-06-28 NOTE — Telephone Encounter (Signed)
Spoke to patient.  States she has received a text message from [place] [facility] regarding a bill for her [employer] test for $400.  States she paid the $99. I spoke with the rep from [employer] who stated the patient did pay $99 however, the original amount was never adjusted.  Advised to allow 3-5 business days for this change to show zero balance on the website.  Pt verbalized understanding. ?

## 2021-09-04 ENCOUNTER — Other Ambulatory Visit: Payer: Self-pay | Admitting: Adult Health

## 2021-11-14 ENCOUNTER — Telehealth: Payer: Self-pay

## 2021-11-14 NOTE — Telephone Encounter (Signed)
Awaiting forms for completion.

## 2021-11-14 NOTE — Telephone Encounter (Signed)
Pt called requesting to have employee physical forms completed. PT states forms will be faxed to office.

## 2021-11-15 ENCOUNTER — Telehealth: Payer: Self-pay

## 2021-11-15 NOTE — Telephone Encounter (Signed)
Patient advised of form completion. She already received a copy. Nothing further needed

## 2021-11-15 NOTE — Telephone Encounter (Signed)
See other encounter.

## 2021-11-15 NOTE — Telephone Encounter (Signed)
Signed and put in box to go up front. Signed:  Crissie Sickles, MD           11/15/2021

## 2021-11-15 NOTE — Telephone Encounter (Signed)
Placed on PCP desk to review and sign, if appropriate.  

## 2021-11-15 NOTE — Telephone Encounter (Signed)
Pt emailed physical form to be completed. Form has been printed and given to [place].

## 2021-11-16 DIAGNOSIS — Z111 Encounter for screening for respiratory tuberculosis: Secondary | ICD-10-CM | POA: Diagnosis not present

## 2022-04-13 ENCOUNTER — Telehealth (INDEPENDENT_AMBULATORY_CARE_PROVIDER_SITE_OTHER): Payer: 59 | Admitting: Family Medicine

## 2022-04-13 ENCOUNTER — Other Ambulatory Visit: Payer: Self-pay | Admitting: Family Medicine

## 2022-04-13 ENCOUNTER — Encounter: Payer: Self-pay | Admitting: Family Medicine

## 2022-04-13 DIAGNOSIS — R930 Abnormal findings on diagnostic imaging of skull and head, not elsewhere classified: Secondary | ICD-10-CM | POA: Diagnosis not present

## 2022-04-13 NOTE — Progress Notes (Signed)
Virtual Visit via Video Note  I connected with Michelle Knapp  on 04/13/22 at  3:40 PM EST by a video enabled telemedicine application and verified that I am speaking with the correct person using two identifiers.  Location patient: [name] Location provider:work or home office Persons participating in the virtual visit: patient, provider  I discussed the limitations and requested verbal permission for telemedicine visit. The patient expressed understanding and agreed to proceed.  HPI: 32 year old female being seen today for sinus concern. 6 months ago at a routine dental cleaning she got an x-ray and she states it showed that a sinus on the right side was "foggy".  She was asymptomatic.  When she returned for cleaning again just recently a repeat x-ray showed no change.  She did not receive any treatment in the interim. Patient states that her dentist recommended she get a CT scan.  No records available to me at this time.  She denies any nasal congestion, face pain, sinus pressure, or headaches. No history of recurrent sinusitis. No teeth or gum pain.  ROS: See pertinent positives and negatives per HPI.  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   Pt aware, will get colonoscopy at 82   Raynaud's phenomenon    Thyromegaly    ultrasound 09/2012 benign-appearing    Past Surgical History:  Procedure Laterality Date   CHOLECYSTECTOMY N/A 11/25/2012   Procedure: LAPAROSCOPIC CHOLECYSTECTOMY WITH INTRAOPERATIVE CHOLANGIOGRAM;  Surgeon: Madilyn Hook, DO;  Location: [facility];  Service: General;  Laterality: N/A;   FOOT SURGERY  age [age] yrs   arches lowered on both feet: hx of both feet with chronic pain and episodes of turning blue   IUD  REMOVAL  03/08/2015     Current Outpatient Medications:    DULoxetine (CYMBALTA) 60 MG capsule, 1 cap po qd, Disp: 90 capsule, Rfl: 3   ESTARYLLA 0.25-35 MG-MCG tablet, TAKE ONE (1) TABLET EACH DAY, Disp: 84 tablet, Rfl: 4   scopolamine (TRANSDERM-SCOP) 1 MG/3DAYS, Place 1 patch (1.5 mg total) onto the skin every 3 (three) days. (Patient not taking: Reported on 04/13/2022), Disp: 4 patch, Rfl: 1  EXAM:  VITALS per patient if applicable:     1/0/6269   11:03 AM 01/27/2021    1:41 PM 12/16/2019   12:58 PM  Vitals with BMI  Height 5' 8.5" '5\' 9"'$  '5\' 9"'$   Weight 256 lbs 3 oz 269 lbs 13 oz 260 lbs 3 oz  BMI 38.38 48.54 62.70  Systolic 350 093 818  Diastolic 78 84 79  Pulse 83 89 95    GENERAL: alert, oriented, appears well and in no acute distress  HEENT: atraumatic, conjunttiva clear, no obvious abnormalities on inspection of external nose and ears  NECK: normal movements of the head and neck  LUNGS: on inspection no signs of respiratory distress, breathing rate appears normal, no obvious gross SOB, gasping or wheezing  CV: no obvious cyanosis  MS: moves all visible extremities without noticeable abnormality  PSYCH/NEURO: pleasant and cooperative, no obvious depression or anxiety, speech and thought processing grossly intact  LABS: none today  ASSESSMENT AND PLAN:  Discussed the following assessment and plan:  Abnormal sinus x-ray: Patient reports right sided sinus opacity. Will obtain  [facility]. Once I review the imaging report I will proceed with ordering the appropriate follow-up imaging.  I discussed the assessment and treatment plan with the patient. The patient was provided an opportunity to ask questions and all were answered. The patient agreed with the plan and demonstrated an understanding of the instructions.   F/u: TBD  Signed:  Crissie Sickles, MD           04/13/2022

## 2022-04-20 ENCOUNTER — Telehealth: Payer: Self-pay | Admitting: Family Medicine

## 2022-04-20 NOTE — Telephone Encounter (Signed)
Noted  

## 2022-04-20 NOTE — Telephone Encounter (Signed)
Pt called was asking for information in regards to a CT she was suppose to have a referral for. I advised her we faxed a medical release form to her dental office and have not received any records as of today. I informed her I would need to call her dental office to see if they can refax the notes to [place] on Monday due to their office being closed on Fridays.

## 2022-07-02 ENCOUNTER — Telehealth: Payer: Self-pay | Admitting: Adult Health

## 2022-07-02 MED ORDER — NORGESTIMATE-ETH ESTRADIOL 0.25-35 MG-MCG PO TABS: ORAL_TABLET | ORAL | 4 refills | Status: DC

## 2022-07-02 NOTE — Telephone Encounter (Signed)
Pt is going on vacation the beginning of June and wants to delay period. Is there something other than doing the continuous birth control that she could do? Please advise. Thanks! JSY

## 2022-07-02 NOTE — Telephone Encounter (Signed)
Pt is wanting another pack of birth control to delay her period while on vacation. Please advise

## 2022-07-02 NOTE — Addendum Note (Signed)
Addended by: Cyril Mourning A on: 07/02/2022 04:14 PM   Modules accepted: Orders

## 2022-07-02 NOTE — Telephone Encounter (Signed)
Will refill estarylla can take continuously for 1 month to delay period. Pt aware pap due this May

## 2022-08-08 ENCOUNTER — Other Ambulatory Visit: Payer: Self-pay | Admitting: Family Medicine

## 2022-08-13 ENCOUNTER — Other Ambulatory Visit: Payer: Self-pay | Admitting: Family Medicine

## 2022-11-07 ENCOUNTER — Ambulatory Visit: Payer: 59 | Admitting: Adult Health

## 2022-11-15 ENCOUNTER — Ambulatory Visit: Payer: 59 | Admitting: Women's Health

## 2022-12-19 ENCOUNTER — Encounter: Payer: Self-pay | Admitting: Adult Health

## 2022-12-19 ENCOUNTER — Ambulatory Visit (INDEPENDENT_AMBULATORY_CARE_PROVIDER_SITE_OTHER): Payer: 59 | Admitting: Adult Health

## 2022-12-19 ENCOUNTER — Other Ambulatory Visit (HOSPITAL_COMMUNITY)
Admission: RE | Admit: 2022-12-19 | Discharge: 2022-12-19 | Disposition: A | Discharge status: Home / Self Care | Payer: 59 | Source: Ambulatory Visit | Attending: Adult Health | Admitting: Adult Health

## 2022-12-19 VITALS — BP 123/82 | HR 96 | Ht 69.0 in | Wt 270.0 lb

## 2022-12-19 DIAGNOSIS — Z01419 Encounter for gynecological examination (general) (routine) without abnormal findings: Secondary | ICD-10-CM | POA: Diagnosis not present

## 2022-12-19 DIAGNOSIS — F419 Anxiety disorder, unspecified: Secondary | ICD-10-CM

## 2022-12-19 DIAGNOSIS — Z3041 Encounter for surveillance of contraceptive pills: Secondary | ICD-10-CM | POA: Diagnosis not present

## 2022-12-19 DIAGNOSIS — Z1329 Encounter for screening for other suspected endocrine disorder: Secondary | ICD-10-CM | POA: Diagnosis not present

## 2022-12-19 DIAGNOSIS — Z13220 Encounter for screening for lipoid disorders: Secondary | ICD-10-CM

## 2022-12-19 MED ORDER — NORGESTIMATE-ETH ESTRADIOL 0.25-35 MG-MCG PO TABS: ORAL_TABLET | ORAL | 4 refills | Status: DC

## 2022-12-19 MED ORDER — DULOXETINE HCL 60 MG PO CPEP: ORAL_CAPSULE | ORAL | 3 refills | Status: DC

## 2022-12-19 NOTE — Progress Notes (Signed)
Patient ID: Michelle Knapp, female   DOB: [birth year], 32 y.o.   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  Current Medications, Allergies, Past Medical History, Past Surgical History, Family History and Social History were reviewed in [employer] record.     Review of Systems: Patient denies any headaches, hearing loss, fatigue, blurred vision, shortness of breath, chest pain, abdominal pain, problems with bowel movements, urination, or intercourse. No joint pain or mood swings.  Has noticed odor at times when on period   Physical Exam:BP 123/82 (BP Location: Right Arm, Patient Position: Sitting, Cuff Size: Large)   Pulse 96   Ht 5\' 9"  (1.753 m)   Wt 270 lb (122.5 kg)   LMP 12/09/2022   BMI 39.87 kg/m   General:  Well developed, well nourished, no acute distress Skin:  Warm and dry Neck:  Midline trachea, normal thyroid, good ROM, no lymphadenopathy Lungs; Clear to auscultation bilaterally Breast:  No dominant palpable mass, retraction, or nipple discharge Cardiovascular: Regular rate and rhythm Abdomen:  Soft, non tender, no hepatosplenomegaly Pelvic:  External genitalia is normal in appearance, no lesions.  The vagina is normal in appearance. Urethra has no lesions or masses. The cervix is smooth, pap with HR HPV genotyping performed.  Uterus is felt to be normal size, shape, and contour.  No adnexal masses or tenderness noted.Bladder is non tender, no masses felt. Extremities/musculoskeletal:  No swelling or varicosities noted, no clubbing or cyanosis Psych:  No mood changes, alert and cooperative,seems happy AA is 1 Fall risk is low    12/19/2022    3:30 PM 01/27/2021    1:43 PM 12/16/2019   12:56 PM  Depression screen PHQ 2/9  Decreased Interest 0 0 0  Down, Depressed, Hopeless 0 0 0  PHQ - 2 Score 0 0 0  Altered sleeping 1     Tired, decreased energy 0    Change in appetite 1    Feeling bad or failure about yourself  1    Trouble concentrating 0    Moving slowly or fidgety/restless 0    Suicidal thoughts 0    PHQ-9 Score 3         12/19/2022    3:31 PM 08/19/2019    3:01 PM  GAD 7 : Generalized Anxiety Score  Nervous, Anxious, on Edge 1 0  Control/stop worrying 1 0  Worry too much - different things 1 0  Trouble relaxing 1 0  Restless 0 0  Easily annoyed or irritable 0 0  Afraid - awful might happen 1 0  Total GAD 7 Score 5 0  Anxiety Difficulty  Not difficult at all    Upstream - 12/19/22 1538       Pregnancy Intention Screening   Does the patient want to become pregnant in the next year? No    Does the patient's partner want to become pregnant in the next year? No    Would the patient like to discuss contraceptive options today? No      Contraception Wrap Up   Current Method Oral Contraceptive    End Method Oral Contraceptive    Contraception Counseling Provided Yes              Examination chaperoned by Malachy Mood LPN  Impression and Plan: 1. Encounter for gynecological examination with Papanicolaou smear of  cervix Pap sent Pap in 3 years if normal Physical in 1 year Will check labs  - Cytology - PAP( [facility]) - CBC - Comprehensive metabolic panel - Lipid panel  2. Encounter for surveillance of contraceptive pills Happy with BCP Meds ordered this encounter  Medications   norgestimate-ethinyl estradiol (ESTARYLLA) 0.25-35 MG-MCG tablet    Sig: TAKE ONE (1) TABLET EACH DAY    Dispense:  84 tablet    Refill:  4    Order Specific Question:   Supervising Provider    Answer:   Despina Hidden, LUTHER H [[id number]]   DULoxetine (CYMBALTA) 60 MG capsule    Sig: TAKE ONE CAPSULE BY MOUTH DAILY    Dispense:  90 capsule    Refill:  3    Order Specific Question:   Supervising Provider    Answer:   Despina Hidden, LUTHER H [[id number]]     3. Screening cholesterol level - Lipid panel  4. Screening  for thyroid disorder - TSH + free T4  5. Anxiety Doing well on Cymbalta 60 mg 1 daily   She is looking for PCP, gave names, Christel Mormon in [place] and Doylene Canard in [place].

## 2022-12-20 LAB — TSH+FREE T4
Free T4: 1 ng/dL (ref 0.82–1.77)
TSH: 1.68 u[IU]/mL (ref 0.450–4.500)

## 2022-12-20 LAB — COMPREHENSIVE METABOLIC PANEL
ALT: 28 IU/L (ref 0–32)
AST: 23 IU/L (ref 0–40)
Albumin: 4.3 g/dL (ref 3.9–4.9)
Alkaline Phosphatase: 106 IU/L (ref 44–121)
BUN/Creatinine Ratio: 19 (ref 9–23)
BUN: 17 mg/dL (ref 6–20)
Bilirubin Total: 0.2 mg/dL (ref 0.0–1.2)
CO2: 25 mmol/L (ref 20–29)
Calcium: 9.7 mg/dL (ref 8.7–10.2)
Chloride: 102 mmol/L (ref 96–106)
Creatinine, Ser: 0.88 mg/dL (ref 0.57–1.00)
Globulin, Total: 2.7 g/dL (ref 1.5–4.5)
Glucose: 87 mg/dL (ref 70–99)
Potassium: 4.5 mmol/L (ref 3.5–5.2)
Sodium: 141 mmol/L (ref 134–144)
Total Protein: 7 g/dL (ref 6.0–8.5)
eGFR: 88 mL/min/{1.73_m2} (ref >59)

## 2022-12-20 LAB — CBC
Hematocrit: 44 % (ref 34.0–46.6)
Hemoglobin: 13.5 g/dL (ref 11.1–15.9)
MCH: 27.4 pg (ref 26.6–33.0)
MCHC: 30.7 g/dL — ABNORMAL LOW (ref 31.5–35.7)
MCV: 89 fL (ref 79–97)
Platelets: 308 10*3/uL (ref 150–450)
RBC: 4.93 x10E6/uL (ref 3.77–5.28)
RDW: 12.8 % (ref 11.7–15.4)
WBC: 10.4 10*3/uL (ref 3.4–10.8)

## 2022-12-20 LAB — LIPID PANEL
Chol/HDL Ratio: 3.7 ratio (ref 0.0–4.4)
Cholesterol, Total: 208 mg/dL — ABNORMAL HIGH (ref 100–199)
HDL: 56 mg/dL (ref >39)
LDL Chol Calc (NIH): 126 mg/dL — ABNORMAL HIGH (ref 0–99)
Triglycerides: 145 mg/dL (ref 0–149)
VLDL Cholesterol Cal: 26 mg/dL (ref 5–40)

## 2022-12-24 LAB — CYTOLOGY - PAP
Comment: NEGATIVE
Diagnosis: NEGATIVE
High risk HPV: NEGATIVE

## 2023-02-09 ENCOUNTER — Telehealth: Payer: 59

## 2023-04-18 ENCOUNTER — Telehealth: Payer: Self-pay

## 2023-04-18 NOTE — Telephone Encounter (Signed)
Sent pt a [facility]. Pt was last seen in 2023. OV is required  Copied from CRM [id number]. Topic: Clinical - Medication Question >> Apr 18, 2023 12:44 PM Corin V wrote: Reason for CRM: Patient and her husband are going on a cruise the first week in February and are requesting that an Rx for motion sickness patches that go behind their ears be sent into her pharmacy: [facility] in [place], [place]

## 2023-04-19 ENCOUNTER — Telehealth: Payer: Commercial Managed Care - HMO | Admitting: Family Medicine

## 2023-04-19 DIAGNOSIS — T753XXA Motion sickness, initial encounter: Secondary | ICD-10-CM

## 2023-04-19 DIAGNOSIS — F411 Generalized anxiety disorder: Secondary | ICD-10-CM | POA: Diagnosis not present

## 2023-04-19 MED ORDER — DULOXETINE HCL 60 MG PO CPEP: ORAL_CAPSULE | ORAL | 3 refills | Status: AC

## 2023-04-19 MED ORDER — SCOPOLAMINE 1 MG/3DAYS TD PT72: 1.0000 | MEDICATED_PATCH | TRANSDERMAL | 0 refills | Status: DC

## 2023-04-19 NOTE — Progress Notes (Signed)
Virtual Visit via Video Note  I connected with Michelle Knapp  on 04/19/23 at  4:00 PM EST by a video enabled telemedicine application and verified that I am speaking with the correct person using two identifiers.  Location patient: [name] Location provider:work or home office Persons participating in the virtual visit: patient, provider  I discussed the limitations and requested verbal permission for telemedicine visit. The patient expressed understanding and agreed to proceed.   HPI: 33 y/o female being seen today for motion sickness. She is going on a cruise in a week or so.  In the past accrues has made her feel nauseous and uncomfortable but her most recent cruise she was on a scopolamine patch and felt very well and asks to get this prescribed again.  Additionally, she requests refill of her duloxetine 60 mg.  She gradually took herself off this in the early part of the winter but noted return of significant anxiety and some fluctuating mood so recently she has gotten back on it and needs refill.  She feels like she is doing well now.  ROS: See pertinent positives and negatives per HPI.  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   Pt aware, will get colonoscopy at 38   Raynaud's phenomenon    Thyromegaly    ultrasound 09/2012 benign-appearing    Past Surgical History:  Procedure Laterality Date   CHOLECYSTECTOMY N/A 11/25/2012   Procedure: LAPAROSCOPIC CHOLECYSTECTOMY WITH INTRAOPERATIVE CHOLANGIOGRAM;  Surgeon: Lodema Pilot, DO;  Location: [facility];  Service: General;  Laterality: N/A;   FOOT SURGERY  age [age] yrs   arches lowered on both feet: hx of both feet with chronic pain and episodes of turning blue   IUD REMOVAL  03/08/2015      Current Outpatient Medications:    norgestimate-ethinyl estradiol (ESTARYLLA) 0.25-35 MG-MCG tablet, TAKE ONE (1) TABLET EACH DAY, Disp: 84 tablet, Rfl: 4   scopolamine (TRANSDERM-SCOP) 1 MG/3DAYS, Place 1 patch (1.5 mg total) onto the skin every 3 (three) days., Disp: 4 patch, Rfl: 0   DULoxetine (CYMBALTA) 60 MG capsule, TAKE ONE CAPSULE BY MOUTH DAILY, Disp: 90 capsule, Rfl: 3  EXAM:  VITALS per patient if applicable:     12/19/2022    3:34 PM 05/02/2021   11:03 AM 01/27/2021    1:41 PM  Vitals with BMI  Height 5\' 9"  5' 8.5" 5\' 9"   Weight 270 lbs 256 lbs 3 oz 269 lbs 13 oz  BMI 39.85 38.38 39.82  Systolic 123 120 191  Diastolic 82 78 84  Pulse 96 83 89     GENERAL: alert, oriented, appears well and in no acute distress  HEENT: atraumatic, conjunttiva clear, no obvious abnormalities on inspection of external nose and ears  NECK: normal movements of the head and neck  LUNGS: on inspection no signs of respiratory distress, breathing rate appears normal, no obvious gross SOB, gasping or wheezing  CV: no obvious cyanosis  MS: moves all visible extremities without noticeable abnormality  PSYCH/NEURO: pleasant and cooperative, no obvious depression or anxiety, speech and thought processing grossly intact  LABS: none today  ASSESSMENT AND PLAN:  Discussed the following assessment and plan:  #1 Motion sickness. Scopolamine patch prescribed today, 1 patch  every 72 hours as needed.  #2 anxiety and depression. Has been stable long-term on duloxetine 60 mg a day.  She needs refill today--> #90, refill x 3.   I discussed the assessment and treatment plan with the patient. The patient was provided an opportunity to ask questions and all were answered. The patient agreed with the plan and demonstrated an understanding of the instructions.   F/u: 6 mo  Signed:  Santiago Bumpers, MD           04/19/2023

## 2023-06-25 ENCOUNTER — Telehealth: Admitting: Physician Assistant

## 2023-06-25 DIAGNOSIS — J019 Acute sinusitis, unspecified: Secondary | ICD-10-CM

## 2023-06-25 DIAGNOSIS — B9689 Other specified bacterial agents as the cause of diseases classified elsewhere: Secondary | ICD-10-CM | POA: Diagnosis not present

## 2023-06-25 MED ORDER — AMOXICILLIN-POT CLAVULANATE 875-125 MG PO TABS
1.0000 | ORAL_TABLET | Freq: Two times a day (BID) | ORAL | 0 refills | Status: DC
Start: 2023-06-25 — End: 2023-10-07

## 2023-06-25 NOTE — Progress Notes (Signed)
 I have spent 5 minutes in review of e-visit questionnaire, review and updating patient chart, medical decision making and response to patient.   Piedad Climes, PA-C

## 2023-06-25 NOTE — Progress Notes (Signed)

## 2023-10-06 ENCOUNTER — Telehealth: Admitting: Physician Assistant

## 2023-10-06 DIAGNOSIS — R197 Diarrhea, unspecified: Secondary | ICD-10-CM

## 2023-10-07 NOTE — Progress Notes (Signed)
  Because of having diarrhea over a week, based on uptodate, I feel your condition warrants further evaluation and I recommend that you be seen in a face-to-face visit. This is so a stool culture and ova & parasite testing can be completed for you.   NOTE: There will be NO CHARGE for this E-Visit   If you are having a true medical emergency, please call 911.     For an urgent face to face visit, [facility] has multiple urgent care centers for your convenience.  Click the link below for the full list of locations and hours, walk-in wait times, appointment scheduling options and driving directions:  Urgent Care - [place], [place], [place], [place], [place], [place]  [place]     Your MyChart E-visit questionnaire answers were reviewed by a board certified advanced clinical practitioner to complete your personal care plan based on your specific symptoms.    Thank you for using e-Visits.     I have spent 5 minutes in review of e-visit questionnaire, review and updating patient chart, medical decision making and response to patient.   Michelle Knapp CHRISTELLA Dickinson, PA-C

## 2023-10-09 ENCOUNTER — Ambulatory Visit: Admitting: Family Medicine

## 2024-01-12 ENCOUNTER — Other Ambulatory Visit: Payer: Self-pay | Admitting: Adult Health
